# Patient Record
Sex: Female | Born: 1965
Health system: Southern US, Community
[De-identification: ages and names within clinical notes are randomized; demographics above are authoritative.]

## PROBLEM LIST (undated history)

## (undated) DIAGNOSIS — R6250 Unspecified lack of expected normal physiological development in childhood: Secondary | ICD-10-CM

## (undated) DIAGNOSIS — F329 Major depressive disorder, single episode, unspecified: Secondary | ICD-10-CM

## (undated) DIAGNOSIS — Q860 Fetal alcohol syndrome (dysmorphic): Secondary | ICD-10-CM

## (undated) DIAGNOSIS — R32 Unspecified urinary incontinence: Secondary | ICD-10-CM

## (undated) DIAGNOSIS — F32A Depression, unspecified: Secondary | ICD-10-CM

## (undated) DIAGNOSIS — F319 Bipolar disorder, unspecified: Secondary | ICD-10-CM

## (undated) DIAGNOSIS — F0281 Dementia in other diseases classified elsewhere with behavioral disturbance: Secondary | ICD-10-CM

## (undated) DIAGNOSIS — R413 Other amnesia: Secondary | ICD-10-CM

## (undated) DIAGNOSIS — F0390 Unspecified dementia without behavioral disturbance: Secondary | ICD-10-CM

## (undated) DIAGNOSIS — R3981 Functional urinary incontinence: Secondary | ICD-10-CM

## (undated) DIAGNOSIS — F71 Moderate intellectual disabilities: Secondary | ICD-10-CM

## (undated) DIAGNOSIS — H3552 Pigmentary retinal dystrophy: Secondary | ICD-10-CM

## (undated) DIAGNOSIS — F819 Developmental disorder of scholastic skills, unspecified: Secondary | ICD-10-CM

## (undated) HISTORY — DX: Fetal alcohol syndrome (dysmorphic): Q86.0

## (undated) HISTORY — DX: Unspecified dementia without behavioral disturbance: F03.90

## (undated) HISTORY — DX: Major depressive disorder, single episode, unspecified: F32.9

## (undated) HISTORY — PX: TONSILLECTOMY: SUR1361

## (undated) HISTORY — DX: Unspecified urinary incontinence: R32

## (undated) HISTORY — DX: Functional urinary incontinence: R39.81

## (undated) HISTORY — DX: Pigmentary retinal dystrophy: H35.52

## (undated) HISTORY — PX: TUBAL LIGATION: SHX77

## (undated) HISTORY — DX: Bipolar disorder, unspecified: F31.9

## (undated) HISTORY — DX: Dementia in other diseases classified elsewhere with behavioral disturbance: F02.81

## (undated) HISTORY — DX: Other amnesia: R41.3

## (undated) HISTORY — DX: Depression, unspecified: F32.A

## (undated) HISTORY — DX: Unspecified lack of expected normal physiological development in childhood: R62.50

## (undated) HISTORY — DX: Developmental disorder of scholastic skills, unspecified: F81.9

## (undated) HISTORY — DX: Moderate intellectual disabilities: F71

---

## 2002-06-14 ENCOUNTER — Encounter: Payer: Self-pay | Admitting: Family Medicine

## 2002-06-14 ENCOUNTER — Encounter: Admission: RE | Admit: 2002-06-14 | Discharge: 2002-06-14 | Payer: Self-pay | Admitting: Family Medicine

## 2002-06-22 ENCOUNTER — Ambulatory Visit: Admission: RE | Admit: 2002-06-22 | Discharge: 2002-06-22 | Payer: Self-pay | Admitting: Family Medicine

## 2005-01-14 ENCOUNTER — Other Ambulatory Visit: Admission: RE | Admit: 2005-01-14 | Discharge: 2005-01-14 | Payer: Self-pay | Admitting: Family Medicine

## 2012-07-19 ENCOUNTER — Telehealth: Payer: Self-pay | Admitting: Nurse Practitioner

## 2012-07-19 NOTE — Telephone Encounter (Signed)
PT NEEDS PE THIS MONTH. AFTER MARCH 20 AND EARLY IN THE AM. WITH MMM

## 2012-07-19 NOTE — Telephone Encounter (Signed)
PT CALLED WITH APPT

## 2012-07-28 ENCOUNTER — Ambulatory Visit (INDEPENDENT_AMBULATORY_CARE_PROVIDER_SITE_OTHER): Payer: BC Managed Care – PPO | Admitting: General Practice

## 2012-07-28 ENCOUNTER — Other Ambulatory Visit: Payer: Self-pay | Admitting: *Deleted

## 2012-07-28 ENCOUNTER — Encounter: Payer: Self-pay | Admitting: General Practice

## 2012-07-28 VITALS — BP 120/67 | HR 87 | Temp 97.3°F | Ht 67.0 in | Wt 200.0 lb

## 2012-07-28 DIAGNOSIS — Z124 Encounter for screening for malignant neoplasm of cervix: Secondary | ICD-10-CM

## 2012-07-28 DIAGNOSIS — Z780 Asymptomatic menopausal state: Secondary | ICD-10-CM

## 2012-07-28 DIAGNOSIS — Z0000 Encounter for general adult medical examination without abnormal findings: Secondary | ICD-10-CM

## 2012-07-28 LAB — LIPID PANEL
Cholesterol: 182 mg/dL (ref 0–200)
HDL: 50 mg/dL (ref >39)
LDL Cholesterol: 115 mg/dL — ABNORMAL HIGH (ref 0–99)
Total CHOL/HDL Ratio: 3.6 Ratio
Triglycerides: 87 mg/dL (ref <150)
VLDL: 17 mg/dL (ref 0–40)

## 2012-07-28 LAB — COMPLETE METABOLIC PANEL WITH GFR
ALT: 10 U/L (ref 0–35)
AST: 9 U/L (ref 0–37)
Albumin: 4.6 g/dL (ref 3.5–5.2)
Alkaline Phosphatase: 71 U/L (ref 39–117)
BUN: 18 mg/dL (ref 6–23)
CO2: 27 mEq/L (ref 19–32)
Calcium: 10.1 mg/dL (ref 8.4–10.5)
Chloride: 105 mEq/L (ref 96–112)
Creat: 1.01 mg/dL (ref 0.50–1.10)
GFR, Est African American: 77 mL/min
GFR, Est Non African American: 66 mL/min
Glucose, Bld: 97 mg/dL (ref 70–99)
Potassium: 3.7 mEq/L (ref 3.5–5.3)
Sodium: 140 mEq/L (ref 135–145)
Total Bilirubin: 0.5 mg/dL (ref 0.3–1.2)
Total Protein: 6.8 g/dL (ref 6.0–8.3)

## 2012-07-28 NOTE — Patient Instructions (Addendum)
Manic Depression (Bipolar Disorder)  Bipolar disorder is also known as manic depressive illness. It is when the brain does not function properly and causes shifts in a person's moods, energy and ability to function in everyday life. These shifts are different from the normal ups and downs that everyone experiences. Instead the shifts are severe. If this goes untreated, the person's life becomes more and more disorderly. People with this disorder can be treated can lead full and productive lives. This disorder must be managed throughout life.   SYMPTOMS    Bipolar disorder causes dramatic mood swings. These mood swings go in cycles. They cycle from extreme "highs" and irritable to deep "lows" of sadness and hopelessness.   Between the extreme moods, there are usually periods of normal mood.   Along with the mood shifts, the person will have severe changes in energy and behavior. The periods of "highs" and "lows" are called episodes of mania and depression.  Signs of mania:   Lots of energy, activity and restlessness.   Extreme "high" or good mood.   Extreme irritability.   Racing thoughts and talking very fast.   Jumping from one idea to another.   Not able to focus, easily distracted.   Little need to sleep.   Grand beliefs in one's abilities and powers.   Spending sprees.   Increased sexual drive. This can result in many sexual partners.   Poor judgment.   Abuse of drugs, particularly cocaine, alcohol, and sleeping medication.   Aggressive or provocative behavior.   A lasting period of behavior that is different from usual.   Denial that anything is wrong.  *A manic episode is identified if a "high" mood happens with three or more of the other symptoms lasting most of the day, nearly everyday for a week or longer. If the mood is more irritable in nature, four additional symptoms must be present.  Signs of depression:   Lasting feelings of sadness, anxiety, or empty mood.   Feelings of  hopelessness with negative thoughts.   Feelings of guilt, worthlessness, or helplessness.   Loss of interest or pleasure in activities once enjoyed, including sex.   Feelings of fatigue or having less energy.   Trouble focusing, making decisions, remembering.   Feeling restless or irritable.   Sleeping too little or too much.   Change in eating with possible weight gain or loss.   Feeling ongoing pain that is not caused by physical illness or injury.   Thoughts of death or suicide or suicide attempts.  *A depressive episode is identified as having five or more of the above symptoms that last most of the day, nearly everyday for two weeks or longer.  CAUSES    Research shows that there is no single cause for the disorder. Many factors act together to produce the illness.   This can be passed down from family (hereditary).   Environment may play a part.  TREATMENT    Long-term treatment is strongly recommended because bipolar disorder is a repeated illness. This disorder is better controlled if treatment is ongoing than if it is off and on.   A combination of medication and talk therapy is best for managing the disorder over time.   Medication.   Medication can be prescribed by a doctor that is an expert in treating mental disorders (psychiatrists). Medications known as "mood stabilizers" are usually prescribed to help control the illness. Other medications can be added when needed. These medicines usually treat episodes   of mania or depression that break through despite the mood stabilizer.   Talk Therapy.   Along with medication, some forms of talk therapy are helpful in providing support, education and guidance to people with the illness and their families. Studies show that this type of treatment increases mood stability, decreases need for hospitalization and improves how they function society.   Electroconvulsive Therapy (ECT).   In extreme situations where the above treatments do not work or  work too slowly to relieve severe symptoms, ECT may be considered.  Document Released: 07/27/2000 Document Revised: 07/13/2011 Document Reviewed: 03/18/2007  [employer] Patient Information 2013 [employer].

## 2012-07-28 NOTE — Progress Notes (Signed)
  Subjective:    Patient ID: Courtney Allen, female    DOB: [birth year], 47 y.o.   MRN: [id number]  HPI Presents today for wellness exam. Patient and spouse reports she is taking meds as prescribed. Patient denies any concerns at this time. Patient and spouse seem to interact well.     Review of Systems  Constitutional: Positive for appetite change. Negative for fever and chills.       Appetite decrease since starting on strattera. Retinitis pigmatosis  HENT: Negative for ear pain and neck pain.   Eyes: Negative for pain.       Reports having no night vision, optometrist aware  Respiratory: Negative for chest tightness and shortness of breath.   Cardiovascular: Negative for chest pain.  Gastrointestinal: Negative for abdominal pain, blood in stool and abdominal distention.  Genitourinary: Negative for flank pain, difficulty urinating and pelvic pain.  Musculoskeletal: Negative for back pain and joint swelling.  Skin: Negative for color change and rash.  Neurological: Negative for dizziness, weakness, numbness and headaches.  Psychiatric/Behavioral: Negative for behavioral problems. The patient is not nervous/anxious.        Medications are used to help pt focus.       Objective:   Physical Exam  Constitutional: She appears well-developed and well-nourished.  HENT:  Head: Normocephalic and atraumatic.  Right Ear: External ear normal.  Left Ear: External ear normal.  Mouth/Throat: Oropharynx is clear and moist.  Eyes: Conjunctivae and EOM are normal. Pupils are equal, round, and reactive to light. Right eye exhibits no discharge.  Neck: Normal range of motion. No JVD present.  Cardiovascular: Normal rate, regular rhythm and normal heart sounds.   Pulmonary/Chest: Effort normal and breath sounds normal. No respiratory distress. She exhibits no tenderness.  Abdominal: Soft. Bowel sounds are normal. She exhibits no distension. There is no tenderness. There is no rebound. Hernia  confirmed negative in the right inguinal area and confirmed negative in the left inguinal area.  Genitourinary: Vagina normal and uterus normal. Guaiac negative stool. No breast swelling, tenderness, discharge or bleeding. Pelvic exam was performed with patient supine. There is no rash, tenderness, lesion or injury on the right labia. There is no rash, tenderness, lesion or injury on the left labia.  Musculoskeletal: Normal range of motion.  Lymphadenopathy:    She has no cervical adenopathy.  Neurological: She is alert.  Patient oriented to self, place, negative for date  Skin: Skin is warm and dry. No erythema.  Psychiatric:  Patient had very flat affect           Assessment & Plan:  Continue all current medications Labs pending, cmp, lipid panel F/u in 3 months Discussed exercise and diet    Raymon Mutton, FNP-C

## 2012-07-29 LAB — PAP IG W/ RFLX HPV ASCU

## 2012-10-14 DIAGNOSIS — F0280 Dementia in other diseases classified elsewhere without behavioral disturbance: Secondary | ICD-10-CM

## 2012-11-29 ENCOUNTER — Ambulatory Visit (INDEPENDENT_AMBULATORY_CARE_PROVIDER_SITE_OTHER): Payer: BC Managed Care – PPO | Admitting: Family Medicine

## 2012-11-29 VITALS — BP 124/72 | HR 68 | Temp 98.4°F | Ht 67.0 in

## 2012-11-29 DIAGNOSIS — R51 Headache: Secondary | ICD-10-CM

## 2012-11-29 DIAGNOSIS — H9209 Otalgia, unspecified ear: Secondary | ICD-10-CM

## 2012-11-29 DIAGNOSIS — F919 Conduct disorder, unspecified: Secondary | ICD-10-CM

## 2012-11-29 DIAGNOSIS — R4189 Other symptoms and signs involving cognitive functions and awareness: Secondary | ICD-10-CM

## 2012-11-29 DIAGNOSIS — R4689 Other symptoms and signs involving appearance and behavior: Secondary | ICD-10-CM

## 2012-11-29 DIAGNOSIS — H9203 Otalgia, bilateral: Secondary | ICD-10-CM

## 2012-11-29 DIAGNOSIS — J019 Acute sinusitis, unspecified: Secondary | ICD-10-CM

## 2012-11-29 MED ORDER — CEFDINIR 300 MG PO CAPS: 300.0000 mg | ORAL_CAPSULE | Freq: Two times a day (BID) | ORAL | Status: DC

## 2012-11-29 NOTE — Progress Notes (Signed)
Patient ID: Courtney Allen, female   DOB: 1965-11-03, 47 y.o.   MRN: [id number] SUBJECTIVE: CC: Chief Complaint  Patient presents with  . Otalgia  . Nasal Congestion  . Headache    HPI: Symptoms as above. Complains of congestion, sinus pain and thather ears hurt and also her forehead. She is moaning and repetitious in her speak. She exhibits cognitive impairment.  Husband gives h/o that he understands that she may have initially fetal alcohol syndrome. Hoever, they have been married for years , and patient was always mentally a bit slow. However since the last 1 to 1 1/2 years the patient has been Dx with retinitis pigmentosa and dementia. The medications are not working for dementia. Also, of note is that her older brother died inhis early 53s from a similar deterioration without a  Dx being made  Past Medical History  Diagnosis Date  . Depression   . Bipolar I disorder, most recent episode (or current) unspecified   . Urinary incontinence    Past Surgical History  Procedure Laterality Date  . Tubal ligation    . Cesarean section    . Tonsillectomy     History   Social History  . Marital Status: Married    Spouse Name: N/A    Number of Children: N/A  . Years of Education: N/A   Occupational History  . Not on file.   Social History Main Topics  . Smoking status: Never Smoker   . Smokeless tobacco: Not on file  . Alcohol Use: No  . Drug Use: No  . Sexually Active: Not on file   Other Topics Concern  . Not on file   Social History Narrative  . No narrative on file   History reviewed. No pertinent family history. Current Outpatient Prescriptions on File Prior to Visit  Medication Sig Dispense Refill  . buPROPion (WELLBUTRIN XL) 300 MG 24 hr tablet Take 150 mg by mouth daily.        No current facility-administered medications on file prior to visit.   No Known Allergies  There is no immunization history on file for this patient. Prior to Admission medications    Medication Sig Start Date End Date Taking? Authorizing Provider  buPROPion (WELLBUTRIN XL) 300 MG 24 hr tablet Take 150 mg by mouth daily.    Yes Historical Provider, MD  donepezil (ARICEPT) 10 MG tablet Take 10 mg by mouth at bedtime as needed.   Yes Historical Provider, MD  hydrOXYzine (ATARAX/VISTARIL) 25 MG tablet Take 25 mg by mouth 3 (three) times daily as needed for itching.   Yes Historical Provider, MD  memantine (NAMENDA) 10 MG tablet Take 10 mg by mouth daily.   Yes Historical Provider, MD  mirtazapine (REMERON) 15 MG tablet Take 7.5 mg by mouth at bedtime.   Yes Historical Provider, MD  cefdinir (OMNICEF) 300 MG capsule Take 1 capsule (300 mg total) by mouth 2 (two) times daily. 11/29/12   Ileana Ladd, MD     ROS: As above in the HPI. All other systems are stable or negative.  OBJECTIVE: APPEARANCE:  Patient in no acute distress.The patient appeared well nourished and normally developed. Acyanotic. Waist: VITAL SIGNS:BP 124/72  Pulse 68  Temp(Src) 98.4 F (36.9 C) (Oral)  Ht 5\' 7"  (1.702 m) WF c/o ear pain repeatedly.  SKIN: warm and  Dry without overt rashes, tattoos and scars  HEAD and Neck: without JVD, Head and scalp: normal Eyes:No scleral icterus. Fundi normal, eye movements normal.  Ears: Auricle normal, canal normal, Tympanic membranes normal, insufflation abnormal TMs do not move.. Nose: nasal congestion. Paranasal sinus tenderness. Throat: normal Neck & thyroid: normal  CHEST & LUNGS: Chest wall: normal Lungs: Clear  CVS: Reveals the PMI to be normally located. Regular rhythm, First and Second Heart sounds are normal,  absence of murmurs, rubs or gallops. Peripheral vasculature: Radial pulses: normal Dorsal pedis pulses: normal Posterior pulses: normal  ABDOMEN:  Appearance: normal Benign, no organomegaly, no masses, no Abdominal Aortic enlargement. No Guarding , no rebound. No Bruits. Bowel sounds: normal  RECTAL: N/A GU:  N/A  EXTREMETIES: nonedematous. Both Femoral and Pedal pulses are normal.  MUSCULOSKELETAL:  Spine: normal Joints: intact  NEUROLOGIC: oriented to  person; no localizing signs in extremities. However patient's speech is slow and she exhibits cognitive impairment.Strength is normal   ASSESSMENT: Otalgia of both ears - Plan: cefdinir (OMNICEF) 300 MG capsule  Sinusitis, acute - Plan: cefdinir (OMNICEF) 300 MG capsule  Cognitive and behavioral changes - Plan: MR Brain W Wo Contrast  Headache(784.0) - Plan: MR Brain W Wo Contrast  PLAN: Orders Placed This Encounter  Procedures  . MR Brain W Wo Contrast    Standing Status: Future     Number of Occurrences:      Standing Expiration Date: 01/30/2014    Order Specific Question:  Reason for Exam (SYMPTOM  OR DIAGNOSIS REQUIRED)    Answer:  47 year old with headaches and rapid  cognitive deterioration, dementia. r/o IC Lesion    Order Specific Question:  Is the patient pregnant?    Answer:  No    Order Specific Question:  Preferred imaging location?    Answer:  [id number]    Order Specific Question:  Does the patient have a pacemaker, internal devices, implants, aneury    Answer:  No   Meds ordered this encounter  Medications  . donepezil (ARICEPT) 10 MG tablet    Sig: Take 10 mg by mouth at bedtime as needed.  . memantine (NAMENDA) 10 MG tablet    Sig: Take 10 mg by mouth daily.  . mirtazapine (REMERON) 15 MG tablet    Sig: Take 7.5 mg by mouth at bedtime.  . hydrOXYzine (ATARAX/VISTARIL) 25 MG tablet    Sig: Take 25 mg by mouth 3 (three) times daily as needed for itching.  . cefdinir (OMNICEF) 300 MG capsule    Sig: Take 1 capsule (300 mg total) by mouth 2 (two) times daily.    Dispense:  20 capsule    Refill:  0   Results for orders placed in visit on 07/28/12  COMPLETE METABOLIC PANEL WITH GFR      Result Value Range   Sodium 140  135 - 145 mEq/L   Potassium 3.7  3.5 - 5.3 mEq/L   Chloride 105  96 - 112 mEq/L    CO2 27  19 - 32 mEq/L   Glucose, Bld 97  70 - 99 mg/dL   BUN 18  6 - 23 mg/dL   Creat 1.61  0.96 - 0.45 mg/dL   Total Bilirubin 0.5  0.3 - 1.2 mg/dL   Alkaline Phosphatase 71  39 - 117 U/L   AST 9  0 - 37 U/L   ALT 10  0 - 35 U/L   Total Protein 6.8  6.0 - 8.3 g/dL   Albumin 4.6  3.5 - 5.2 g/dL   Calcium 40.9  8.4 - 81.1 mg/dL   GFR, Est African American  77     GFR, Est Non African American 66    LIPID PANEL      Result Value Range   Cholesterol 182  0 - 200 mg/dL   Triglycerides 87  <130 mg/dL   HDL 50  >86 mg/dL   Total CHOL/HDL Ratio 3.6     VLDL 17  0 - 40 mg/dL   LDL Cholesterol 578 (*) 0 - 99 mg/dL  PAP IG W/ RFLX HPV ASCU      Result Value Range   Specimen adequacy:       FINAL DIAGNOSIS:       COMMENTS:       Cytotechnologist:        Await the MRI results. Keep appointment with the Neurologist.  Return if symptoms worsen or fail to improve.  Zelta Enfield P. Modesto Charon, M.D.

## 2012-12-04 ENCOUNTER — Ambulatory Visit
Admission: RE | Admit: 2012-12-04 | Discharge: 2012-12-04 | Disposition: A | Discharge status: Home / Self Care | Payer: BC Managed Care – PPO | Source: Ambulatory Visit | Attending: Family Medicine | Admitting: Family Medicine

## 2012-12-04 DIAGNOSIS — R51 Headache: Secondary | ICD-10-CM

## 2012-12-04 DIAGNOSIS — R4189 Other symptoms and signs involving cognitive functions and awareness: Secondary | ICD-10-CM

## 2012-12-04 MED ORDER — GADOBENATE DIMEGLUMINE 529 MG/ML IV SOLN
19.0000 mL | Freq: Once | INTRAVENOUS | Status: AC | PRN
  Administered 2012-12-04: 19 mL via INTRAVENOUS

## 2012-12-05 NOTE — Progress Notes (Signed)
Quick Note:  MRI abnormal. Atrophy more advanced than for her age. No tumors. Keep appointment with the neurologist. ______

## 2013-01-17 ENCOUNTER — Encounter: Payer: Self-pay | Admitting: Neurology

## 2013-01-18 ENCOUNTER — Ambulatory Visit (INDEPENDENT_AMBULATORY_CARE_PROVIDER_SITE_OTHER): Payer: BC Managed Care – PPO | Admitting: Neurology

## 2013-01-18 ENCOUNTER — Encounter: Payer: Self-pay | Admitting: Neurology

## 2013-01-18 VITALS — BP 113/69 | HR 57 | Resp 17 | Ht 67.0 in | Wt 180.0 lb

## 2013-01-18 DIAGNOSIS — IMO0002 Reserved for concepts with insufficient information to code with codable children: Secondary | ICD-10-CM

## 2013-01-18 DIAGNOSIS — F0281 Dementia in other diseases classified elsewhere with behavioral disturbance: Secondary | ICD-10-CM

## 2013-01-18 DIAGNOSIS — F71 Moderate intellectual disabilities: Secondary | ICD-10-CM

## 2013-01-18 DIAGNOSIS — F02818 Dementia in other diseases classified elsewhere, unspecified severity, with other behavioral disturbance: Secondary | ICD-10-CM

## 2013-01-18 DIAGNOSIS — F819 Developmental disorder of scholastic skills, unspecified: Secondary | ICD-10-CM | POA: Insufficient documentation

## 2013-01-18 DIAGNOSIS — F8189 Other developmental disorders of scholastic skills: Secondary | ICD-10-CM

## 2013-01-18 DIAGNOSIS — Q860 Fetal alcohol syndrome (dysmorphic): Secondary | ICD-10-CM

## 2013-01-18 DIAGNOSIS — G2571 Drug induced akathisia: Secondary | ICD-10-CM

## 2013-01-18 DIAGNOSIS — R259 Unspecified abnormal involuntary movements: Secondary | ICD-10-CM

## 2013-01-18 DIAGNOSIS — R3981 Functional urinary incontinence: Secondary | ICD-10-CM | POA: Insufficient documentation

## 2013-01-18 DIAGNOSIS — H3552 Pigmentary retinal dystrophy: Secondary | ICD-10-CM | POA: Insufficient documentation

## 2013-01-18 HISTORY — DX: Functional urinary incontinence: R39.81

## 2013-01-18 HISTORY — DX: Fetal alcohol syndrome (dysmorphic): Q86.0

## 2013-01-18 HISTORY — DX: Moderate intellectual disabilities: F71

## 2013-01-18 HISTORY — DX: Dementia in other diseases classified elsewhere, unspecified severity, with other behavioral disturbance: F02.818

## 2013-01-18 HISTORY — DX: Developmental disorder of scholastic skills, unspecified: F81.9

## 2013-01-18 HISTORY — DX: Pigmentary retinal dystrophy: H35.52

## 2013-01-18 HISTORY — DX: Dementia in other diseases classified elsewhere with behavioral disturbance: F02.81

## 2013-01-18 MED ORDER — PROPRANOLOL HCL 10 MG PO TABS: 10.0000 mg | ORAL_TABLET | ORAL | Status: DC

## 2013-01-18 MED ORDER — DONEPEZIL HCL 10 MG PO TABS: 10.0000 mg | ORAL_TABLET | Freq: Every evening | ORAL | Status: DC | PRN

## 2013-01-18 MED ORDER — MIRTAZAPINE 15 MG PO TABS: 7.5000 mg | ORAL_TABLET | Freq: Every day | ORAL | Status: DC

## 2013-01-18 MED ORDER — BUPROPION HCL ER (XL) 150 MG PO TB24: 150.0000 mg | ORAL_TABLET | Freq: Every day | ORAL | Status: DC

## 2013-01-18 NOTE — Patient Instructions (Signed)
Alzheimer's Disease Alzheimer's disease is a breaking down of the brain that sometimes happens with aging. It often affects memory, thinking, and speaking. It also affects how you get along with people and job performance. Often the changes come on slowly and are not very noticeable. The changes may be silent and hidden for a long time, even from family and friends. Mood and personality changes often cause the family to seek medical care. CAUSES  Alzheimer's disease is the leading cause of dementia. Dementia is a reduced ability of the working of the brain. Alzheimer's is one type of dementia and is caused by small changes in the brain. There are many causes of Alzheimer's disease. DIAGNOSIS  There are no specific tests for Alzheimer's disease, other than doing a biopsy(tissue sample) of the brain. This is not usually done. Physical examination and history often provide the best clues for a diagnosis. Often the diagnosis may be made over time, with more observation.  TREATMENT  There is no specific treatment for Alzheimer's disease. Your caregivers will discuss the particular problems you are dealing with or may deal with. They can help direct you to other caregivers who can help in the care of Alzheimer's patients.  Document Released: 01/28/2005 Document Revised: 07/13/2011 Document Reviewed: 09/20/2006 [facility] Patient Information 2014 [place], [place].

## 2013-01-18 NOTE — Progress Notes (Addendum)
[facility]  Provider:  Melvyn Novas, M D  Referring Provider: Ernestina Penna, MD Primary Care Physician:  Rudi Heap, MD  Chief Complaint  Patient presents with  . New Evaluation    dementia,Poulos,mild mental retardation, paper referral,rm 11    HPI:  Courtney Allen is a 47 y.o. female  Is seen here as a referral/ revisit  from Dr. Gerome Sam / Dr. Leonides Cave:  This patient  Has retinitis pigmentosa as does her brother, both were born to an alcoholic Mother. The  patient had learning disabilities, never graduated from [employer],  limited reading and writing abilities- that have further decreased.  Her brother and herself are reportedly belligerent, impulsive, and have  Trouble with anger management. Dr Leonides Cave suspected early dementia and she needs to continue treatment with antipsychotics.  The patient begun treatment for a memory  problems with Namenda and Aricept she developed compulsive scratching, repetitive motions and  sudden restlessness occurred , acatesia  and  She continues to have non verbal output. She does not necessarily make eye contact. She is now on Remeron  For sleep and to increase her appetite after she lost a lot of weight.   Her husband  Is afraid that she may need to move to an institution. She has no hallucinations, but rare delusions. She has never finished high school , she has 3 healthy , bright  children , is married over 30 years .   Review of Systems: Out of a complete 14 system review, the patient complains of only the following symptoms, and all other reviewed systems are negative.  MMSE - 13 out of 26 points, adjusted or blind patient. Urinary incontinence, stiff  With propulsive gait, small steps, repetitive non-verbal sounds,  Difficulties with smooth movements.    History   Social History  . Marital Status: Married    Spouse Name: [name]    Number of Children: 3  . Years of Education: 11   Occupational History  . disabled     Social History Main Topics  . Smoking status: Never Smoker   . Smokeless tobacco: Never Used  . Alcohol Use: No  . Drug Use: No  . Sexual Activity: Not on file   Other Topics Concern  . Not on file   Social History Narrative  . No narrative on file    Family History  Problem Relation Age of Onset  . Alcohol abuse Mother   . Emphysema Brother     Past Medical History  Diagnosis Date  . Depression   . Bipolar I disorder, most recent episode (or current) unspecified   . Urinary incontinence   . Memory loss   . Developmental delay   . Dementia     Past Surgical History  Procedure Laterality Date  . Tubal ligation    . Cesarean section    . Tonsillectomy      Current Outpatient Prescriptions  Medication Sig Dispense Refill  . buPROPion (WELLBUTRIN XL) 300 MG 24 hr tablet Take 150 mg by mouth daily.       . cholecalciferol (VITAMIN D) 1000 UNITS tablet Take 1,000 Units by mouth daily.      Marland Kitchen donepezil (ARICEPT) 10 MG tablet Take 10 mg by mouth at bedtime as needed.      . mirtazapine (REMERON) 15 MG tablet Take 7.5 mg by mouth at bedtime.       No current facility-administered medications for this visit.    Allergies as of 01/18/2013  . (  No Known Allergies)    Vitals: BP 113/69  Pulse 57  Resp 17  Ht 5\' 7"  (1.702 m)  Wt 180 lb (81.647 kg)  BMI 28.19 kg/m2 Last Weight:  Wt Readings from Last 1 Encounters:  01/18/13 180 lb (81.647 kg)   Last Height:   Ht Readings from Last 1 Encounters:  01/18/13 5\' 7"  (1.702 m)    Physical exam:  General: The patient is awake, alert and appears not in acute distress. The patient is well groomed. Head: Normocephalic, atraumatic. Neck is supple. Mallampati 3 , neck circumference: 14.5 , Cardiovascular:  Regular rate and rhythm, without  murmurs or carotid bruit, and without distended neck veins. Respiratory: Lungs are clear to auscultation. Skin:  Without evidence of edema, or rash Trunk: propulsive.   Neurologic  exam : The patient is awake and alert, oriented to place and time.  Memory severly impaired ,. abnormal attention span & concentration ability. Speech is  Non- fluent , aphasia. Mood and affect are aloof, not depressed but " Uninvolved".  Cranial nerves: Pupils are equal and briskly reactive to light. Retinitis pigmentosa,. Extraocular movements  in vertical and horizontal planes not intact a- due to limites tunnel vision , Visual fields.. Hearing to finger rub intact.  Facial sensation intact to fine touch. Facial motor strength is bilaterally reduced , droopy mouth - tongue and uvula move midline.  Motor exam:  Decreased  tone and normal muscle bulk ,   Sensory:  Fine touch, pinprick and vibration were tested in all extremities. Proprioception isnormal.  Coordination: Rapid alternating movements in the fingers/hands is tested and normal.  Finger-to-nose maneuver tested and normal without evidence of ataxia, dysmetria or tremor.  Gait and station: Patient walks without assistive device -Stance is stable and normal. Tandem gait is deferred, the patient has propulsive e tendency , falling risk higher when on incline or  Walking downstairs. Gait and turns  fragmented. Romberg testing is abnormal. Wide based.   Deep tendon reflexes: in the  upper and lower extremities are symmetric and intact. Babinski maneuver response is equivocal .   Assessment:  After physical and neurologic examination, review of laboratory studies, imaging, neurophysiology testing and pre-existing records, assessment is that of a multifactorial cognitive decline.  I need to review Dr. Maxwell Marion notes in detail , I agree with a dementia process.  The patient has some features of MRDD learning disability and retinitis related vision loss, genetic . Filum labium is present.   Coarse motor skills,  Urinary incontinence , MMSE 13 -26 points. NPH rule out needed, CT or MRI review- 12-04-12 without evidence of NPH. EEG needed,   Alzheimer's superimposed on fetal alcohol syndrome is likely, plus psychiatric history and medication related changes in motor function.     Addendum : The results of the patient's neuropsychological testing for finally available for view at the end of her visit. Dr. fell from consider the patient mildly mentally retarded based on attending a regular high school, in that she did not graduate from it. He noted her learning disabilities. Review of the sup types of tests performed, makes the diagnosis of dementia most likely. Concerning is that the patient had only recently imaging studies of the brain but there is no comparison material to show [place] if the small brain size is long-standing, or a recent development. The patient's body habitus, her gait, incontinence and the restlessness or petechia may reflect tardive dyskinesia and secondary parkinsonism.  I have ordered an EEG for today, and  we'll get in touch with Dr. Lance Coon to see if the patient has been treated with neuroleptics in the past, had delusional or hallucinatory psychosis. Visit duration 70 minutes off which holds was spent in obtaining psychiatric, psychological, medical history, family history and establishing and need for home health care or other  Assistance,  in allowing to this patient to stay and her hormone environment as long as possible.

## 2013-01-19 ENCOUNTER — Telehealth: Payer: Self-pay | Admitting: Neurology

## 2013-01-19 DIAGNOSIS — F71 Moderate intellectual disabilities: Secondary | ICD-10-CM

## 2013-01-19 DIAGNOSIS — Q860 Fetal alcohol syndrome (dysmorphic): Secondary | ICD-10-CM

## 2013-01-19 DIAGNOSIS — R3981 Functional urinary incontinence: Secondary | ICD-10-CM

## 2013-01-19 NOTE — Telephone Encounter (Signed)
Patient is requesting 30mg  rather than 15mg  of Remeron.  Okay to change dose?  Please advise.  Thank you.

## 2013-01-20 ENCOUNTER — Ambulatory Visit (INDEPENDENT_AMBULATORY_CARE_PROVIDER_SITE_OTHER): Payer: BC Managed Care – PPO | Admitting: Family Medicine

## 2013-01-20 ENCOUNTER — Encounter: Payer: Self-pay | Admitting: Family Medicine

## 2013-01-20 VITALS — BP 100/61 | HR 58 | Temp 98.8°F | Ht 67.0 in | Wt 181.0 lb

## 2013-01-20 DIAGNOSIS — R300 Dysuria: Secondary | ICD-10-CM

## 2013-01-20 MED ORDER — CEPHALEXIN 500 MG PO CAPS: 500.0000 mg | ORAL_CAPSULE | Freq: Three times a day (TID) | ORAL | Status: DC

## 2013-01-20 NOTE — Progress Notes (Signed)
  Subjective:    Patient ID: Courtney Allen, female    DOB: June 21, 1965, 47 y.o.   MRN: [id number]  HPI DYSURIA Onset:  4 weeks  Description: worsening urinary incontinence in setting of baseline urinary incontinence, dysuria. No flank pain. No hematuria. Last UTI > 1 year ago.  Modifying factors: Baseline dementia and MR. Has been taking aricept. Husband is wondering if Aricept is causing worsening urinary incontinence.  Symptoms Urgency:  yes Frequency: yes  Hesitancy:  yes Hematuria:  n Flank Pain:  no Fever: no Nausea/Vomiting:  no Missed LMP: no STD exposure: no Discharge: no Irritants: no Rash: no  Red Flags   More than 3 UTI's last 12 months:  no PMH of  Diabetes or Immunosuppression:  no Renal Disease/Calculi: no Urinary Tract Abnormality:  no Instrumentation or Trauma: no      Review of Systems  All other systems reviewed and are negative.       Objective:   Physical Exam  Constitutional: She appears well-nourished.  HENT:  Head: Normocephalic and atraumatic.  Eyes: Conjunctivae are normal. Pupils are equal, round, and reactive to light.  Neck: Normal range of motion. Neck supple.  Cardiovascular: Normal rate and regular rhythm.   Pulmonary/Chest: Effort normal and breath sounds normal.  Abdominal: Soft. Bowel sounds are normal.  No flank pain, minimal suprapubic tenderness.  Musculoskeletal: Normal range of motion.  Neurological:  Verbal, ruminating.  Skin: Skin is warm.          Assessment & Plan:  Dysuria - Plan: POCT UA - Microscopic Only, POCT urinalysis dipstick, Urine culture, cephALEXin (KEFLEX) 500 MG capsule  Will treat with Keflex. Urine culture. It is likely that Aricept is having A procholinergic effects that is worsening urinary incontinence. Discussed with husband that this may need to be adjusted with the neuropsychologist. Will otherwise consider urology followup for this issue if it persists. Discuss infectious and  genitourinary red flags at length with the patient's husband including fever, back pain. Followup as needed.

## 2013-01-20 NOTE — Addendum Note (Signed)
Addended by: Prescott Gum on: 01/20/2013 12:09 PM   Modules accepted: Orders

## 2013-01-23 MED ORDER — MIRTAZAPINE 15 MG PO TABS: 30.0000 mg | ORAL_TABLET | Freq: Every day | ORAL | Status: DC

## 2013-01-23 NOTE — Telephone Encounter (Signed)
Ok to double, prescribed for 90 days.

## 2013-01-27 ENCOUNTER — Other Ambulatory Visit (INDEPENDENT_AMBULATORY_CARE_PROVIDER_SITE_OTHER): Payer: Self-pay

## 2013-01-27 ENCOUNTER — Telehealth: Payer: Self-pay | Admitting: Family Medicine

## 2013-01-27 DIAGNOSIS — Z0289 Encounter for other administrative examinations: Secondary | ICD-10-CM

## 2013-01-27 NOTE — Telephone Encounter (Signed)
Urine specimen was not collected.

## 2013-02-03 NOTE — Telephone Encounter (Signed)
Multiple attempts made to reach patient by phone. 

## 2013-02-27 ENCOUNTER — Encounter: Payer: Self-pay | Admitting: General Practice

## 2013-02-27 ENCOUNTER — Ambulatory Visit (INDEPENDENT_AMBULATORY_CARE_PROVIDER_SITE_OTHER): Payer: BC Managed Care – PPO | Admitting: General Practice

## 2013-02-27 ENCOUNTER — Encounter (INDEPENDENT_AMBULATORY_CARE_PROVIDER_SITE_OTHER): Payer: Self-pay

## 2013-02-27 VITALS — BP 105/63 | HR 55 | Temp 97.0°F | Ht 67.0 in | Wt 180.0 lb

## 2013-02-27 DIAGNOSIS — R350 Frequency of micturition: Secondary | ICD-10-CM

## 2013-02-27 DIAGNOSIS — N390 Urinary tract infection, site not specified: Secondary | ICD-10-CM

## 2013-02-27 DIAGNOSIS — IMO0001 Reserved for inherently not codable concepts without codable children: Secondary | ICD-10-CM

## 2013-02-27 LAB — POCT URINALYSIS DIPSTICK
Bilirubin, UA: NEGATIVE
Blood, UA: NEGATIVE
Glucose, UA: NEGATIVE
Ketones, UA: NEGATIVE
Nitrite, UA: NEGATIVE
Protein, UA: NEGATIVE
Spec Grav, UA: 1.025
Urobilinogen, UA: NEGATIVE
pH, UA: 5

## 2013-02-27 LAB — POCT UA - MICROSCOPIC ONLY
Bacteria, U Microscopic: NEGATIVE
Casts, Ur, LPF, POC: NEGATIVE
Crystals, Ur, HPF, POC: NEGATIVE
Mucus, UA: NEGATIVE
RBC, urine, microscopic: NEGATIVE
Yeast, UA: NEGATIVE

## 2013-02-27 MED ORDER — CIPROFLOXACIN HCL 500 MG PO TABS: 500.0000 mg | ORAL_TABLET | Freq: Two times a day (BID) | ORAL | Status: DC

## 2013-02-27 NOTE — Progress Notes (Signed)
  Subjective:    Patient ID: Courtney Allen, female    DOB: 05/27/1965, 47 y.o.   MRN: [id number]  Urinary Frequency  This is a new problem. The current episode started 1 to 4 weeks ago (after being started on dementia medication). The problem occurs every urination. The problem has been unchanged. The quality of the pain is described as burning. There has been no fever. She is sexually active. There is no history of pyelonephritis. Associated symptoms include frequency and urgency. Pertinent negatives include no chills, flank pain or hematuria. She has tried nothing for the symptoms. There is no history of catheterization, kidney stones, recurrent UTIs or a urological procedure.       Review of Systems  Constitutional: Negative for fever and chills.  Respiratory: Negative for chest tightness and shortness of breath.   Cardiovascular: Negative for chest pain.  Genitourinary: Positive for urgency and frequency. Negative for hematuria and flank pain.  Neurological: Negative for dizziness, weakness and headaches.       Objective:   Physical Exam  Constitutional: She appears well-developed and well-nourished.  Cardiovascular: Normal rate, regular rhythm and normal heart sounds.   Pulmonary/Chest: Effort normal and breath sounds normal.  Abdominal: Soft. Bowel sounds are normal. She exhibits no distension. There is tenderness in the suprapubic area.  Neurological: She is alert.  Oriented to self  Skin: Skin is warm and dry.  Psychiatric:  Behavior abnormal. Dementia with behavioral disturbances     Results for orders placed in visit on 02/27/13  POCT URINALYSIS DIPSTICK      Result Value Range   Color, UA yellow     Clarity, UA clear     Glucose, UA neg     Bilirubin, UA neg     Ketones, UA neg     Spec Grav, UA 1.025     Blood, UA neg     pH, UA 5.0     Protein, UA neg     Urobilinogen, UA negative     Nitrite, UA neg     Leukocytes, UA Trace    POCT UA - MICROSCOPIC ONLY       Result Value Range   WBC, Ur, HPF, POC 5-10     RBC, urine, microscopic neg     Bacteria, U Microscopic neg     Mucus, UA neg     Epithelial cells, urine per micros occ     Crystals, Ur, HPF, POC neg     Casts, Ur, LPF, POC neg     Yeast, UA neg         Assessment & Plan:  1. Frequency  - POCT urinalysis dipstick - POCT UA - Microscopic Only  2. UTI (urinary tract infection)  - ciprofloxacin (CIPRO) 500 MG tablet; Take 1 tablet (500 mg total) by mouth 2 (two) times daily.  Dispense: 20 tablet; Refill: 0 -Increase fluid intake AZO over the counter X2 days Frequent voiding Proper perineal hygiene RTO prn Patient verbalized understanding Coralie Keens, FNP-C

## 2013-02-27 NOTE — Patient Instructions (Signed)
Urinary Tract Infection  Urinary tract infections (UTIs) can develop anywhere along your urinary tract. Your urinary tract is your body's drainage system for removing wastes and extra water. Your urinary tract includes two kidneys, two ureters, a bladder, and a urethra. Your kidneys are a pair of bean-shaped organs. Each kidney is about the size of your fist. They are located below your ribs, one on each side of your spine.  CAUSES  Infections are caused by microbes, which are microscopic organisms, including fungi, viruses, and bacteria. These organisms are so small that they can only be seen through a microscope. Bacteria are the microbes that most commonly cause UTIs.  SYMPTOMS   Symptoms of UTIs may vary by age and gender of the patient and by the location of the infection. Symptoms in young women typically include a frequent and intense urge to urinate and a painful, burning feeling in the bladder or urethra during urination. Older women and men are more likely to be tired, shaky, and weak and have muscle aches and abdominal pain. A fever may mean the infection is in your kidneys. Other symptoms of a kidney infection include pain in your back or sides below the ribs, nausea, and vomiting.  DIAGNOSIS  To diagnose a UTI, your caregiver will ask you about your symptoms. Your caregiver also will ask to provide a urine sample. The urine sample will be tested for bacteria and white blood cells. White blood cells are made by your body to help fight infection.  TREATMENT   Typically, UTIs can be treated with medication. Because most UTIs are caused by a bacterial infection, they usually can be treated with the use of antibiotics. The choice of antibiotic and length of treatment depend on your symptoms and the type of bacteria causing your infection.  HOME CARE INSTRUCTIONS   If you were prescribed antibiotics, take them exactly as your caregiver instructs you. Finish the medication even if you feel better after you  have only taken some of the medication.   Drink enough water and fluids to keep your urine clear or pale yellow.   Avoid caffeine, tea, and carbonated beverages. They tend to irritate your bladder.   Empty your bladder often. Avoid holding urine for long periods of time.   Empty your bladder before and after sexual intercourse.   After a bowel movement, women should cleanse from front to back. Use each tissue only once.  SEEK MEDICAL CARE IF:    You have back pain.   You develop a fever.   Your symptoms do not begin to resolve within 3 days.  SEEK IMMEDIATE MEDICAL CARE IF:    You have severe back pain or lower abdominal pain.   You develop chills.   You have nausea or vomiting.   You have continued burning or discomfort with urination.  MAKE SURE YOU:    Understand these instructions.   Will watch your condition.   Will get help right away if you are not doing well or get worse.  Document Released: 01/28/2005 Document Revised: 10/20/2011 Document Reviewed: 05/29/2011  [employer] Patient Information 2014 [employer].

## 2013-03-02 ENCOUNTER — Telehealth: Payer: Self-pay | Admitting: *Deleted

## 2013-03-02 NOTE — Telephone Encounter (Signed)
Call pt to notify her that Fannie Knee needs to reshed her to next week. Patient did not answer after 3 call. Lft VM for her to call and resched

## 2013-03-03 ENCOUNTER — Other Ambulatory Visit: Payer: BC Managed Care – PPO | Admitting: Radiology

## 2013-03-10 ENCOUNTER — Ambulatory Visit (INDEPENDENT_AMBULATORY_CARE_PROVIDER_SITE_OTHER): Payer: BC Managed Care – PPO

## 2013-03-10 DIAGNOSIS — F71 Moderate intellectual disabilities: Secondary | ICD-10-CM

## 2013-03-10 DIAGNOSIS — Q860 Fetal alcohol syndrome (dysmorphic): Secondary | ICD-10-CM

## 2013-03-10 DIAGNOSIS — R3981 Functional urinary incontinence: Secondary | ICD-10-CM

## 2013-03-10 DIAGNOSIS — IMO0002 Reserved for concepts with insufficient information to code with codable children: Secondary | ICD-10-CM

## 2013-03-10 NOTE — Procedures (Signed)
  History:  Courtney Allen is a 47 year old patient with a history of fetal alcohol syndrome, and some history of memory problems. The patient has retinitis pigmentosa. The patient is being evaluated for some problems with a progressive memory disorder.  This is a routine EEG. No skull defects are noted. Medications include Wellbutrin, vitamin D, Cipro, Aricept, and Remeron.   EEG classification: Essentially normal awake  Description of the recording: The background rhythms of this recording consists of a fairly well modulated medium amplitude alpha rhythm of 9 Hz that is reactive to eye opening and closure. As the record progresses, the patient appears to remain in the waking state throughout the recording. Photic stimulation was performed, resulting in a bilateral and symmetric photic driving response. Hyperventilation was not performed. Throughout the recording, prominent head movement artifact was seen, oftentimes obscuring background rhythm activities. At no time during the recording does there appear to be evidence of spike or spike wave discharges or evidence of focal slowing. EKG monitor shows no evidence of cardiac rhythm abnormalities with a heart rate of 56.  Impression: This is an essentially normal EEG recording in the waking state. No evidence of ictal or interictal discharges are seen. This was a technically difficult study secondary to excessive head movement associated with artifact on EEG.

## 2013-03-17 ENCOUNTER — Telehealth: Payer: Self-pay | Admitting: Neurology

## 2013-03-22 ENCOUNTER — Telehealth: Payer: Self-pay | Admitting: Neurology

## 2013-03-22 NOTE — Telephone Encounter (Signed)
Spoke with husband and said that will call as soon as physician reviews results

## 2013-03-22 NOTE — Telephone Encounter (Signed)
See notes from 11/19

## 2013-03-23 NOTE — Telephone Encounter (Signed)
Results called to patient per Lynden Ang B

## 2013-04-19 ENCOUNTER — Ambulatory Visit (INDEPENDENT_AMBULATORY_CARE_PROVIDER_SITE_OTHER): Payer: BC Managed Care – PPO | Admitting: Family Medicine

## 2013-04-19 ENCOUNTER — Encounter: Payer: Self-pay | Admitting: Family Medicine

## 2013-04-19 VITALS — BP 88/48 | HR 49 | Temp 96.9°F | Ht 67.0 in | Wt 175.0 lb

## 2013-04-19 DIAGNOSIS — H6093 Unspecified otitis externa, bilateral: Secondary | ICD-10-CM

## 2013-04-19 DIAGNOSIS — H6693 Otitis media, unspecified, bilateral: Secondary | ICD-10-CM

## 2013-04-19 DIAGNOSIS — H6690 Otitis media, unspecified, unspecified ear: Secondary | ICD-10-CM

## 2013-04-19 DIAGNOSIS — I9589 Other hypotension: Secondary | ICD-10-CM

## 2013-04-19 DIAGNOSIS — I952 Hypotension due to drugs: Secondary | ICD-10-CM

## 2013-04-19 DIAGNOSIS — H60399 Other infective otitis externa, unspecified ear: Secondary | ICD-10-CM

## 2013-04-19 MED ORDER — NEOMYCIN-POLYMYXIN-HC 3.5-10000-1 OT SOLN: 3.0000 [drp] | Freq: Four times a day (QID) | OTIC | Status: DC

## 2013-04-19 MED ORDER — AZITHROMYCIN 250 MG PO TABS: ORAL_TABLET | ORAL | Status: DC

## 2013-04-19 NOTE — Progress Notes (Signed)
   Subjective:    Patient ID: Courtney Allen, female    DOB: 10/10/1965, 47 y.o.   MRN: [id number]  HPI EAR PAIN Location:  L ear  Description: l ear pain and discomfort  Onset:  3-4 days  Modifying factors: none   Symptoms  Sensation of fullness: yes Ear discharge: no URI symptoms: minimal  Fever: ni Tinnitus:no   Dizziness:no   Hearing loss:no   Toothache: no Rashes or lesions: no Facial muscle weakness: no  Red Flags Recent trauma: no PMH prior ear surgery:  no Diabetes or Immunosuppresion: no    Pt also recently started on clonidine for attention issues per husband.  Pt has had markedly low BPs at home with systolics running into the 80s-90s.  No weakness, syncopal episodes to date.  Feels like medication is not helping and is making BP drop too low.    Review of Systems  All other systems reviewed and are negative.       Objective:   Physical Exam  Constitutional: She appears well-developed and well-nourished.  HENT:  Head: Normocephalic and atraumatic.  Bilateral ear canal tenderness to otoscopic evaluation Mild L ear canal erythema and TM bulging +nasal erythema, rhinorrhea bilaterally, + post oropharyngeal erythema    Eyes: Conjunctivae are normal. Pupils are equal, round, and reactive to light.  Neck: Normal range of motion.  Cardiovascular: Normal rate and regular rhythm.   Pulmonary/Chest: Effort normal and breath sounds normal.  Abdominal: Soft.  Musculoskeletal: Normal range of motion.  Neurological: She is alert.  Skin: Skin is warm.          Assessment & Plan:  OE (otitis externa), bilateral - Plan: neomycin-polymyxin-hydrocortisone (CORTISPORIN) otic solution  Otitis, bilateral - Plan: azithromycin (ZITHROMAX) 250 MG tablet  Hypotension due to drugs  Will place on cortisporin and zpak  Discussed supportive care and ENT red flags.  Follow up as needed.  Discussed gradual taper of clonidine as to avoid rebound hypertension.    Follow up with neuro/psych to discuss this issue.

## 2013-05-01 ENCOUNTER — Telehealth: Payer: Self-pay | Admitting: Nurse Practitioner

## 2013-05-01 NOTE — Telephone Encounter (Signed)
appt tomorrow with Courtney Allen for uti

## 2013-05-02 ENCOUNTER — Ambulatory Visit (INDEPENDENT_AMBULATORY_CARE_PROVIDER_SITE_OTHER): Payer: BC Managed Care – PPO | Admitting: General Practice

## 2013-05-02 ENCOUNTER — Encounter: Payer: Self-pay | Admitting: General Practice

## 2013-05-02 VITALS — BP 112/69 | HR 63 | Temp 97.0°F | Ht 67.0 in | Wt 175.0 lb

## 2013-05-02 DIAGNOSIS — R350 Frequency of micturition: Secondary | ICD-10-CM

## 2013-05-02 DIAGNOSIS — IMO0001 Reserved for inherently not codable concepts without codable children: Secondary | ICD-10-CM

## 2013-05-02 DIAGNOSIS — N390 Urinary tract infection, site not specified: Secondary | ICD-10-CM

## 2013-05-02 LAB — POCT URINALYSIS DIPSTICK
Bilirubin, UA: NEGATIVE
Glucose, UA: NEGATIVE
Ketones, UA: NEGATIVE
Nitrite, UA: NEGATIVE
Protein, UA: NEGATIVE
Spec Grav, UA: 1.02
Urobilinogen, UA: NEGATIVE
pH, UA: 6

## 2013-05-02 MED ORDER — NITROFURANTOIN MONOHYD MACRO 100 MG PO CAPS: 100.0000 mg | ORAL_CAPSULE | Freq: Two times a day (BID) | ORAL | Status: DC

## 2013-05-02 NOTE — Patient Instructions (Signed)
Urinary Tract Infection  Urinary tract infections (UTIs) can develop anywhere along your urinary tract. Your urinary tract is your body's drainage system for removing wastes and extra water. Your urinary tract includes two kidneys, two ureters, a bladder, and a urethra. Your kidneys are a pair of bean-shaped organs. Each kidney is about the size of your fist. They are located below your ribs, one on each side of your spine.  CAUSES  Infections are caused by microbes, which are microscopic organisms, including fungi, viruses, and bacteria. These organisms are so small that they can only be seen through a microscope. Bacteria are the microbes that most commonly cause UTIs.  SYMPTOMS   Symptoms of UTIs may vary by age and gender of the patient and by the location of the infection. Symptoms in young women typically include a frequent and intense urge to urinate and a painful, burning feeling in the bladder or urethra during urination. Older women and men are more likely to be tired, shaky, and weak and have muscle aches and abdominal pain. A fever may mean the infection is in your kidneys. Other symptoms of a kidney infection include pain in your back or sides below the ribs, nausea, and vomiting.  DIAGNOSIS  To diagnose a UTI, your caregiver will ask you about your symptoms. Your caregiver also will ask to provide a urine sample. The urine sample will be tested for bacteria and white blood cells. White blood cells are made by your body to help fight infection.  TREATMENT   Typically, UTIs can be treated with medication. Because most UTIs are caused by a bacterial infection, they usually can be treated with the use of antibiotics. The choice of antibiotic and length of treatment depend on your symptoms and the type of bacteria causing your infection.  HOME CARE INSTRUCTIONS   If you were prescribed antibiotics, take them exactly as your caregiver instructs you. Finish the medication even if you feel better after you  have only taken some of the medication.   Drink enough water and fluids to keep your urine clear or pale yellow.   Avoid caffeine, tea, and carbonated beverages. They tend to irritate your bladder.   Empty your bladder often. Avoid holding urine for long periods of time.   Empty your bladder before and after sexual intercourse.   After a bowel movement, women should cleanse from front to back. Use each tissue only once.  SEEK MEDICAL CARE IF:    You have back pain.   You develop a fever.   Your symptoms do not begin to resolve within 3 days.  SEEK IMMEDIATE MEDICAL CARE IF:    You have severe back pain or lower abdominal pain.   You develop chills.   You have nausea or vomiting.   You have continued burning or discomfort with urination.  MAKE SURE YOU:    Understand these instructions.   Will watch your condition.   Will get help right away if you are not doing well or get worse.  Document Released: 01/28/2005 Document Revised: 10/20/2011 Document Reviewed: 05/29/2011  [employer] Patient Information 2014 [employer].

## 2013-05-02 NOTE — Progress Notes (Signed)
   Subjective:    Patient ID: Courtney Allen, female    DOB: 1965-05-18, 47 y.o.   MRN: [id number]  Urinary Tract Infection  This is a new problem. The current episode started in the past 7 days. The problem has been gradually worsening. The quality of the pain is described as aching. There has been no fever. She is sexually active. There is no history of pyelonephritis. Associated symptoms include frequency and urgency. Pertinent negatives include no chills, flank pain, hematuria, nausea or vomiting. She has tried nothing for the symptoms. Her past medical history is significant for recurrent UTIs.  Patient as some mental retardation and dementia, but able to answer some simply questions.    Review of Systems  Constitutional: Negative for chills.  Respiratory: Negative for chest tightness and shortness of breath.   Cardiovascular: Negative for chest pain and palpitations.  Gastrointestinal: Negative for nausea and vomiting.  Genitourinary: Positive for urgency and frequency. Negative for hematuria and flank pain.       Objective:   Physical Exam  Constitutional: She appears well-developed and well-nourished.  Cardiovascular: Normal rate, regular rhythm and normal heart sounds.   Pulmonary/Chest: Effort normal and breath sounds normal. No respiratory distress. She exhibits no tenderness.  Abdominal: Soft. Bowel sounds are normal. She exhibits no distension. There is tenderness in the suprapubic area.  Neurological: She is alert.  Skin: Skin is warm and dry. No rash noted.  Psychiatric: She has a normal mood and affect.     Results for orders placed in visit on 05/02/13  POCT URINALYSIS DIPSTICK      Result Value Range   Color, UA yellow     Clarity, UA clear     Glucose, UA neg     Bilirubin, UA neg     Ketones, UA neg     Spec Grav, UA 1.020     Blood, UA trace     pH, UA 6.0     Protein, UA neg     Urobilinogen, UA negative     Nitrite, UA neg     Leukocytes, UA Trace           Assessment & Plan  1. Frequency  - POCT urinalysis dipstick  2. UTI (urinary tract infection)  - nitrofurantoin, macrocrystal-monohydrate, (MACROBID) 100 MG capsule; Take 1 capsule (100 mg total) by mouth 2 (two) times daily.  Dispense: 20 capsule; Refill: 0 -Increase fluid intake AZO over the counter X2 days Frequent voiding Proper perineal hygiene RTO if symptoms worsen or unresolved, will refer to urology fo recurrent  uti Patient's husband verbalized understanding Coralie Keens, FNP-C

## 2013-05-05 ENCOUNTER — Ambulatory Visit: Payer: BC Managed Care – PPO | Admitting: Nurse Practitioner

## 2013-05-26 ENCOUNTER — Encounter: Payer: Self-pay | Admitting: Nurse Practitioner

## 2013-05-26 ENCOUNTER — Ambulatory Visit (INDEPENDENT_AMBULATORY_CARE_PROVIDER_SITE_OTHER): Payer: BC Managed Care – PPO | Admitting: Nurse Practitioner

## 2013-05-26 VITALS — BP 96/59 | HR 60 | Ht 67.0 in | Wt 178.0 lb

## 2013-05-26 DIAGNOSIS — G2571 Drug induced akathisia: Secondary | ICD-10-CM

## 2013-05-26 DIAGNOSIS — Q860 Fetal alcohol syndrome (dysmorphic): Secondary | ICD-10-CM

## 2013-05-26 DIAGNOSIS — F0281 Dementia in other diseases classified elsewhere with behavioral disturbance: Secondary | ICD-10-CM

## 2013-05-26 DIAGNOSIS — F02818 Dementia in other diseases classified elsewhere, unspecified severity, with other behavioral disturbance: Secondary | ICD-10-CM

## 2013-05-26 DIAGNOSIS — R3981 Functional urinary incontinence: Secondary | ICD-10-CM

## 2013-05-26 DIAGNOSIS — F819 Developmental disorder of scholastic skills, unspecified: Secondary | ICD-10-CM

## 2013-05-26 DIAGNOSIS — R259 Unspecified abnormal involuntary movements: Secondary | ICD-10-CM

## 2013-05-26 DIAGNOSIS — G2569 Other tics of organic origin: Secondary | ICD-10-CM

## 2013-05-26 DIAGNOSIS — F8189 Other developmental disorders of scholastic skills: Secondary | ICD-10-CM

## 2013-05-26 DIAGNOSIS — F71 Moderate intellectual disabilities: Secondary | ICD-10-CM

## 2013-05-26 DIAGNOSIS — H3552 Pigmentary retinal dystrophy: Secondary | ICD-10-CM

## 2013-05-26 DIAGNOSIS — IMO0002 Reserved for concepts with insufficient information to code with codable children: Secondary | ICD-10-CM

## 2013-05-26 MED ORDER — DONEPEZIL HCL 5 MG PO TABS: 5.0000 mg | ORAL_TABLET | Freq: Every evening | ORAL | Status: DC | PRN

## 2013-05-26 NOTE — Patient Instructions (Addendum)
Reduce Aricept to 5 mg  for memory problems.  Dr. Vickey Hugerohmeier will research the possible conditions and contact you for next step.  [facility]

## 2013-05-26 NOTE — Progress Notes (Addendum)
PATIENT: Courtney Allen DOB: 1965-07-28   REASON FOR VISIT: follow up for dementia HISTORY FROM: patient  HISTORY OF PRESENT ILLNESS: Courtney Allen is a 48 y.o. female Is seen here as a referral/ revisit from Dr. Gerome SamPoulus / Dr. Leonides CaveZelson:  This patient Has retinitis pigmentosa as does her brother, both were born to an alcoholic Mother.  The patient had learning disabilities, never graduated from [employer], limited reading and writing abilities- that have further decreased.  Her brother and herself are reportedly belligerent, impulsive, and have Trouble with anger management. Dr Leonides CaveZelson suspected early dementia and she needs to continue treatment with antipsychotics.  The patient begun treatment for a memory problems with Namenda and Aricept she developed compulsive scratching, repetitive motions and sudden restlessness occurred, with increased Chinaakatesia. She does not necessarily make eye contact. She is now on Remeron for sleep and to increase her appetite after she lost a lot of weight.  Her husband is afraid that she may need to move to an institution. She has no hallucinations, but rare delusions. She has never finished high school, she has 3 healthy, bright children with advanced education; she is married over 30 years.   Review of Systems:  Out of a complete 14 system review, the patient complains of only the following symptoms, and all other reviewed systems are negative.   REVIEW OF SYSTEMS: Full 14 system review of systems performed and notable only for:  Ear/Nose/Throat: runny nose Genitourinary: incontinenece Allergy/Immunology: frequent infections Neurological: memory loss Psychiatric: behavior problem, decreased concentration stiff With propulsive gait, small steps, repetitive non-verbal sounds, and vocal tics, with loud outbursts.  Difficulties with smooth movements.   ALLERGIES: No Known Allergies  HOME MEDICATIONS: Outpatient Prescriptions Prior to Visit    Medication Sig Dispense Refill  . buPROPion (WELLBUTRIN XL) 150 MG 24 hr tablet Take 1 tablet (150 mg total) by mouth daily.  90 tablet  3  . cholecalciferol (VITAMIN D) 1000 UNITS tablet Take 1,000 Units by mouth daily.      Marland Kitchen. donepezil (ARICEPT) 10 MG tablet Take 1 tablet (10 mg total) by mouth at bedtime as needed.  90 tablet  3  . hydrOXYzine (ATARAX/VISTARIL) 10 MG tablet Take 10 mg by mouth at bedtime as needed for anxiety.      . mirtazapine (REMERON) 15 MG tablet Take 2 tablets (30 mg total) by mouth at bedtime. Take one at bedtime po.  90 tablet  3   No facility-administered medications prior to visit.    PAST MEDICAL HISTORY: Past Medical History  Diagnosis Date  . Depression   . Bipolar I disorder, most recent episode (or current) unspecified   . Urinary incontinence   . Memory loss   . Developmental delay   . Dementia   . Mental retardation, moderate (I.Q. 35-49) 01/18/2013  . Fetal alcohol syndrome 01/18/2013  . General learning disability 01/18/2013  . Urinary incontinence due to cognitive impairment 01/18/2013  . Retinitis pigmentosa, both eyes 01/18/2013  . Dementia in conditions classified elsewhere with behavioral disturbance 01/18/2013    Superimposed alzheimer's dementia in a patient with known static encephalopathy.     PAST SURGICAL HISTORY: Past Surgical History  Procedure Laterality Date  . Tubal ligation    . Cesarean section    . Tonsillectomy      FAMILY HISTORY: Family History  Problem Relation Age of Onset  . Alcohol abuse Mother   . Emphysema Brother     SOCIAL HISTORY: History   Social History  .  Marital Status: Married    Spouse Name: [name]    Number of Children: 3  . Years of Education: 11   Occupational History  . disabled    Social History Main Topics  . Smoking status: Never Smoker   . Smokeless tobacco: Never Used  . Alcohol Use: No  . Drug Use: No  . Sexual Activity: Not on file   Other Topics Concern  . Not on file    Social History Narrative  . No narrative on file     PHYSICAL EXAM  Filed Vitals:   05/26/13 1105  BP: 96/59  Pulse: 60  Height: 5\' 7"  (1.702 m)  Weight: 178 lb (80.74 kg)   Body mass index is 27.87 kg/(m^2).  General: The patient is awake, alert and appears not in acute distress, but VERY RESTLESS.  The patient is well groomed.  Head: Normocephalic, atraumatic. Neck is supple. Mallampati 3 , neck circumference: 14.5 ,  Cardiovascular: Regular rate and rhythm, without murmurs or carotid bruit, and without distended neck veins.   Skin: Without evidence of edema, or rash  Trunk: propulsive.   Neurologic exam :  The patient is awake and alert, oriented to place and time.  Memory severly impaired, abnormal attention span & concentration ability.  MMSE 13/26 adjusted for the blind. Speech is non-fluent, she has repetitive nonverbal and verbal sounds with occasional loud outbursts, vocal tics.  Mood and affect are aloof, not depressed but "Uninvolved".  Cranial nerves:  Pupils are equal and briskly reactive to light. Retinitis pigmentosa. Extraocular movements in vertical and horizontal planes not intact a- due to limited tunnel vision. Hearing to finger rub intact. Facial sensation intact to fine touch. Facial motor strength is bilaterally reduced, droopy mouth - tongue and uvula move midline.  Motor exam: Decreased tone and normal muscle bulk. Sensory: Fine touch, pinprick and vibration were tested in all extremities and appear normal. Coordination:  Finger-to-nose maneuver tested and normal without evidence of ataxia, dysmetria or tremor.  Gait and station: Patient walks without assistive device -Stance is stable and normal. Tandem gait is deferred, the patient has propulsive tendency, falling risk higher when on incline or Walking downstairs. Gait and turns fragmented. Romberg testing is abnormal. Wide based.  Deep tendon reflexes: in the upper and lower extremities are symmetric  and intact.   DIAGNOSTIC DATA (LABS, IMAGING, TESTING) - I reviewed patient records, labs, notes, testing and imaging myself where available.   ASSESSMENT AND PLAN After physical and neurologic examination, review of laboratory studies, imaging, neurophysiology testing and pre-existing records, assessment is that of a multifactorial progressive cognitive decline.  The patient has some features of MRDD learning disability and retinitis related vision loss, genetic. Filum labium is present.  Coarse motor skills, Urinary incontinence , MMSE 13 -26 points.  Brain imaging 12-04-12 without evidence of NPH.  Alzheimer's superimposed on fetal alcohol syndrome is likely, plus psychiatric history and medication related changes in motor function.  The results of the patient's neuropsychological testing indicate that Dr. Leonides Cave considers the patient mildly mentally retarded based on attending a regular high school, in that she did not graduate from it. He noted her learning disabilities. Her EEG is normal. 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  PLAN: 1. Reduce Aricept to 5 mg daily as this has seemed to intensify akathisia and vocal tics. 2. Consultation with Geneticist is needed.  Melvyn Novas, MD 05/26/2013, 11:24 AM [facility] [address] [place], [place] [address] [phone]  Note: This document was prepared with digital dictation and possible smart phrase technology. Any transcriptional errors that result from this process are unintentional.   Addendum by Dr. Porfirio Mylar Dohmeier;  I spoke in detail with Mrs. and Mr. Seth Bake. after their visit with  Heide Guile  had concluded,   The patient has a brother with retinitis pigmentosa which has also affected her vision.  But she had learning disabilities of her school life, her brother apparently did not and neither her 3 children- the oldest two daughters attend college now. The  patient presents with  Abnormal  movements,  repetitive  movements , at times tic like - she can not suppress these-  and she presents with a quite significant dementia based on the Mini-Mental Status Examination. I would like for her to be evaluated by a dementia specialist at a tertiary care center with genetic testing as necessary. The patient's brain MRI , obtained 2014 before her referral to [facility] took place, is doing significant atrophy over the gyri, and especially concerned over the frontal lobes. There is no white matter disease. The hippocampi and mesial temporal lobe intact, this may reflect a frontal lobe dementia at early age. As I explained,  there are extraocular movement restrictions, and the patient is constantly moving, ongoing whilst in a seated position.

## 2013-05-29 NOTE — Addendum Note (Signed)
Addended by: Melvyn NovasHMEIER, Reathel Turi on: 05/29/2013 02:04 PM   Modules accepted: Orders, Level of Service

## 2013-06-09 ENCOUNTER — Telehealth: Payer: Self-pay | Admitting: Nurse Practitioner

## 2013-06-09 NOTE — Telephone Encounter (Signed)
Spoke with patient's husband and informed that a referral had been placed for Courtney Allen, gave him their number to call to sched appt.he verbalized understanding.

## 2013-06-09 NOTE — Telephone Encounter (Signed)
Called patient and left message for return call, concerning further testing. Patient does have a referral to Dr Gershon CraneLefkowitz,David

## 2013-06-14 ENCOUNTER — Other Ambulatory Visit: Payer: Self-pay | Admitting: General Practice

## 2013-06-14 ENCOUNTER — Other Ambulatory Visit (INDEPENDENT_AMBULATORY_CARE_PROVIDER_SITE_OTHER): Payer: BC Managed Care – PPO

## 2013-06-14 DIAGNOSIS — R350 Frequency of micturition: Secondary | ICD-10-CM

## 2013-06-14 LAB — POCT UA - MICROSCOPIC ONLY
Casts, Ur, LPF, POC: NEGATIVE
Crystals, Ur, HPF, POC: NEGATIVE
Mucus, UA: NEGATIVE
RBC, urine, microscopic: NEGATIVE
Yeast, UA: NEGATIVE

## 2013-06-14 LAB — POCT URINALYSIS DIPSTICK
Bilirubin, UA: NEGATIVE
Blood, UA: NEGATIVE
Glucose, UA: NEGATIVE
Ketones, UA: NEGATIVE
Leukocytes, UA: NEGATIVE
Nitrite, UA: NEGATIVE
Protein, UA: NEGATIVE
Spec Grav, UA: 1.015
Urobilinogen, UA: NEGATIVE
pH, UA: 6

## 2013-06-24 ENCOUNTER — Other Ambulatory Visit: Payer: Self-pay | Admitting: General Practice

## 2013-06-24 DIAGNOSIS — N390 Urinary tract infection, site not specified: Secondary | ICD-10-CM

## 2013-07-14 DIAGNOSIS — F0390 Unspecified dementia without behavioral disturbance: Secondary | ICD-10-CM

## 2013-07-14 DIAGNOSIS — F03918 Unspecified dementia, unspecified severity, with other behavioral disturbance: Secondary | ICD-10-CM | POA: Insufficient documentation

## 2013-07-14 DIAGNOSIS — H3552 Pigmentary retinal dystrophy: Secondary | ICD-10-CM | POA: Insufficient documentation

## 2013-07-14 DIAGNOSIS — F0391 Unspecified dementia with behavioral disturbance: Secondary | ICD-10-CM | POA: Insufficient documentation

## 2013-07-28 ENCOUNTER — Encounter: Payer: Self-pay | Admitting: General Practice

## 2013-07-28 ENCOUNTER — Ambulatory Visit (INDEPENDENT_AMBULATORY_CARE_PROVIDER_SITE_OTHER): Payer: BC Managed Care – PPO | Admitting: General Practice

## 2013-07-28 VITALS — BP 98/60 | HR 72 | Temp 97.0°F | Ht 67.0 in | Wt 175.0 lb

## 2013-07-28 DIAGNOSIS — Z0000 Encounter for general adult medical examination without abnormal findings: Secondary | ICD-10-CM

## 2013-07-28 DIAGNOSIS — E785 Hyperlipidemia, unspecified: Secondary | ICD-10-CM

## 2013-07-28 DIAGNOSIS — Z124 Encounter for screening for malignant neoplasm of cervix: Secondary | ICD-10-CM

## 2013-07-28 DIAGNOSIS — Z01419 Encounter for gynecological examination (general) (routine) without abnormal findings: Secondary | ICD-10-CM

## 2013-07-28 LAB — POCT CBC
Granulocyte percent: 52.8 %G (ref 37–80)
HCT, POC: 38.1 % (ref 37.7–47.9)
Hemoglobin: 12 g/dL — AB (ref 12.2–16.2)
Lymph, poc: 2.1 (ref 0.6–3.4)
MCH, POC: 29 pg (ref 27–31.2)
MCHC: 31.6 g/dL — AB (ref 31.8–35.4)
MCV: 91.8 fL (ref 80–97)
MPV: 8.3 fL (ref 0–99.8)
POC Granulocyte: 2.7 (ref 2–6.9)
POC LYMPH PERCENT: 40.7 %L (ref 10–50)
Platelet Count, POC: 176 10*3/uL (ref 142–424)
RBC: 4.2 M/uL (ref 4.04–5.48)
RDW, POC: 13.4 %
WBC: 5.2 10*3/uL (ref 4.6–10.2)

## 2013-07-28 NOTE — Progress Notes (Signed)
   Subjective:    Patient ID: Courtney Allen, female    DOB: April 16, 1966, 48 y.o.   MRN: [id number]  HPI Patient presents today for annual exam and pap. She is accompanied by her husband. Patient has dementia and mental retardation, but able to answer questions appropriately.     Review of Systems  Constitutional: Negative for fever and chills.  Respiratory: Negative for chest tightness and shortness of breath.   Cardiovascular: Negative for chest pain and palpitations.  Gastrointestinal: Negative for vomiting, abdominal pain, constipation and blood in stool.  Genitourinary: Negative for dysuria, frequency, hematuria and difficulty urinating.  Neurological: Negative for dizziness, weakness and headaches.  Psychiatric/Behavioral: Positive for behavioral problems.       Objective:   Physical Exam  Constitutional: She is oriented to person, place, and time. She appears well-developed and well-nourished.  HENT:  Head: Normocephalic and atraumatic.  Right Ear: External ear normal.  Left Ear: External ear normal.  Mouth/Throat: Oropharynx is clear and moist.  Eyes: Conjunctivae and EOM are normal. Pupils are equal, round, and reactive to light.  Neck: Normal range of motion. Neck supple. No thyromegaly present.  Cardiovascular: Normal rate, regular rhythm, normal heart sounds and intact distal pulses.   Pulmonary/Chest: Effort normal and breath sounds normal. No respiratory distress. She exhibits no tenderness. Right breast exhibits no inverted nipple, no mass, no nipple discharge, no skin change and no tenderness. Left breast exhibits no inverted nipple, no mass, no nipple discharge, no skin change and no tenderness. Breasts are symmetrical.  Abdominal: Soft. Bowel sounds are normal. She exhibits no distension.  Genitourinary: Vagina normal and uterus normal. No breast swelling, tenderness, discharge or bleeding. No labial fusion. There is no rash, tenderness, lesion or injury on the  right labia. There is no rash, tenderness, lesion or injury on the left labia. Uterus is not deviated, not enlarged, not fixed and not tender. Cervix exhibits no motion tenderness, no discharge and no friability. Right adnexum displays no mass, no tenderness and no fullness. Left adnexum displays no mass, no tenderness and no fullness. No erythema, tenderness or bleeding around the vagina. No foreign body around the vagina. No signs of injury around the vagina. No vaginal discharge found.  Lymphadenopathy:    She has no cervical adenopathy.  Neurological: She is alert and oriented to person, place, and time.  Skin: Skin is warm and dry.  Psychiatric:  Abnormal behavior           Assessment & Plan:  1. Encounter for routine gynecological examination - Pap IG w/ reflex to HPV when ASC-U  2. Annual physical exam  - POCT CBC - CMP14+EGFR  3. Hyperlipidemia  - Lipid panel -Continue all current medications Labs pending F/u in 3 months Discussed benefits of regular exercise and healthy eating Patient and husband verbalized understanding Erby Pian, FNP-C

## 2013-07-28 NOTE — Patient Instructions (Signed)

## 2013-07-29 LAB — CMP14+EGFR
ALT: 13 IU/L (ref 0–32)
AST: 13 IU/L (ref 0–40)
Albumin/Globulin Ratio: 2.3 (ref 1.1–2.5)
Albumin: 4.6 g/dL (ref 3.5–5.5)
Alkaline Phosphatase: 78 IU/L (ref 39–117)
BUN/Creatinine Ratio: 21 (ref 9–23)
BUN: 18 mg/dL (ref 6–24)
CO2: 24 mmol/L (ref 18–29)
Calcium: 9.2 mg/dL (ref 8.7–10.2)
Chloride: 105 mmol/L (ref 97–108)
Creatinine, Ser: 0.87 mg/dL (ref 0.57–1.00)
GFR calc Af Amer: 91 mL/min/{1.73_m2} (ref >59)
GFR calc non Af Amer: 79 mL/min/{1.73_m2} (ref >59)
Globulin, Total: 2 g/dL (ref 1.5–4.5)
Glucose: 86 mg/dL (ref 65–99)
Potassium: 4.3 mmol/L (ref 3.5–5.2)
Sodium: 144 mmol/L (ref 134–144)
Total Bilirubin: 0.3 mg/dL (ref 0.0–1.2)
Total Protein: 6.6 g/dL (ref 6.0–8.5)

## 2013-07-29 LAB — LIPID PANEL
Chol/HDL Ratio: 3.5 ratio units (ref 0.0–4.4)
Cholesterol, Total: 189 mg/dL (ref 100–199)
HDL: 54 mg/dL (ref >39)
LDL Calculated: 114 mg/dL — ABNORMAL HIGH (ref 0–99)
Triglycerides: 105 mg/dL (ref 0–149)
VLDL Cholesterol Cal: 21 mg/dL (ref 5–40)

## 2013-08-01 LAB — PAP IG W/ RFLX HPV ASCU: PAP Smear Comment: 0

## 2013-11-24 ENCOUNTER — Encounter: Payer: Self-pay | Admitting: General Practice

## 2013-11-24 ENCOUNTER — Ambulatory Visit (INDEPENDENT_AMBULATORY_CARE_PROVIDER_SITE_OTHER): Payer: BC Managed Care – PPO | Admitting: General Practice

## 2013-11-24 VITALS — BP 105/71 | HR 52 | Temp 96.8°F | Ht 67.0 in | Wt 183.2 lb

## 2013-11-24 DIAGNOSIS — R300 Dysuria: Secondary | ICD-10-CM

## 2013-11-24 DIAGNOSIS — N390 Urinary tract infection, site not specified: Secondary | ICD-10-CM

## 2013-11-24 LAB — POCT URINALYSIS DIPSTICK
Bilirubin, UA: NEGATIVE
Glucose, UA: NEGATIVE
Ketones, UA: NEGATIVE
Nitrite, UA: POSITIVE
Protein, UA: NEGATIVE
Spec Grav, UA: >=1.03
Urobilinogen, UA: NEGATIVE
pH, UA: 7.5

## 2013-11-24 MED ORDER — CIPROFLOXACIN HCL 500 MG PO TABS: 500.0000 mg | ORAL_TABLET | Freq: Two times a day (BID) | ORAL | Status: DC

## 2013-11-24 NOTE — Patient Instructions (Signed)

## 2013-11-24 NOTE — Progress Notes (Signed)
   Subjective:    Patient ID: Courtney Allen, female    DOB: Jan 08, 1966, 48 y.o.   MRN: [id number]  Dysuria  This is a recurrent problem. The problem has been gradually worsening. The pain is at a severity of 0/10. She is sexually active. There is no history of pyelonephritis. Associated symptoms include flank pain, frequency and urgency. Pertinent negatives include no chills, hematuria or nausea. She has tried nothing for the symptoms. Her past medical history is significant for recurrent UTIs.  Patient has a history of fetal alcohol syndrome and dementia. She is accompanied by her husband. Courtney Allen seems to answer questions appropriately with yes and no, that were asked about urinary symptoms. Patient's husband reports she has been seen by a urologist, who discussed urological procedure. Patient's husband reports they have declined to have procedure at this time.     Review of Systems  Constitutional: Negative for fever and chills.  Respiratory: Negative for chest tightness and shortness of breath.   Cardiovascular: Negative for chest pain and palpitations.  Gastrointestinal: Negative for nausea and abdominal pain.  Genitourinary: Positive for dysuria, urgency, frequency and flank pain. Negative for hematuria.  Neurological: Negative for dizziness and weakness.       Objective:   Physical Exam  Constitutional: She is oriented to person, place, and time. She appears well-developed and well-nourished.  Cardiovascular: Normal rate, regular rhythm and normal heart sounds.   Pulmonary/Chest: Effort normal and breath sounds normal. No respiratory distress. She exhibits no tenderness.  Abdominal: She exhibits no distension. There is no tenderness.  Neurological: She is alert and oriented to person, place, and time.  Skin: Skin is warm and dry.  Psychiatric: She has a normal mood and affect.      Results for orders placed in visit on 11/24/13  POCT URINALYSIS DIPSTICK      Result Value  Ref Range   Color, UA yellow     Clarity, UA cloudy     Glucose, UA neg     Bilirubin, UA neg     Ketones, UA neg     Spec Grav, UA >=1.030     Blood, UA trace     pH, UA 7.5     Protein, UA neg     Urobilinogen, UA negative     Nitrite, UA pos     Leukocytes, UA moderate (2+)         Assessment & Plan:  1. Dysuria  - POCT urinalysis dipstick  2. Urinary tract infection, site not specified  - ciprofloxacin (CIPRO) 500 MG tablet; Take 1 tablet (500 mg total) by mouth 2 (two) times daily.  Dispense: 20 tablet; Refill: 0 - Urine culture -discussed and provided patient educational material, UTI -discussed importance of proper perineal care  -May return to office if symptoms worsen  -Will need to follow up with urologist if symptoms unresolved  -Patient husband verbalized understanding and in agreement Courtney Allen E. Darly Massi, FNP-C

## 2013-11-26 LAB — URINE CULTURE

## 2013-12-28 ENCOUNTER — Other Ambulatory Visit (INDEPENDENT_AMBULATORY_CARE_PROVIDER_SITE_OTHER): Payer: BC Managed Care – PPO

## 2013-12-28 DIAGNOSIS — F0281 Dementia in other diseases classified elsewhere with behavioral disturbance: Secondary | ICD-10-CM

## 2013-12-28 DIAGNOSIS — Z79899 Other long term (current) drug therapy: Secondary | ICD-10-CM

## 2013-12-28 DIAGNOSIS — F02818 Dementia in other diseases classified elsewhere, unspecified severity, with other behavioral disturbance: Secondary | ICD-10-CM

## 2013-12-29 LAB — LIPID PANEL
Chol/HDL Ratio: 3.8 ratio units (ref 0.0–4.4)
Cholesterol, Total: 178 mg/dL (ref 100–199)
HDL: 47 mg/dL (ref >39)
LDL Calculated: 111 mg/dL — ABNORMAL HIGH (ref 0–99)
Triglycerides: 101 mg/dL (ref 0–149)
VLDL Cholesterol Cal: 20 mg/dL (ref 5–40)

## 2013-12-29 LAB — CMP14+EGFR
ALT: 8 IU/L (ref 0–32)
AST: 10 IU/L (ref 0–40)
Albumin/Globulin Ratio: 2.1 (ref 1.1–2.5)
Albumin: 4.2 g/dL (ref 3.5–5.5)
Alkaline Phosphatase: 77 IU/L (ref 39–117)
BUN/Creatinine Ratio: 15 (ref 9–23)
BUN: 13 mg/dL (ref 6–24)
CO2: 23 mmol/L (ref 18–29)
Calcium: 9.3 mg/dL (ref 8.7–10.2)
Chloride: 104 mmol/L (ref 97–108)
Creatinine, Ser: 0.84 mg/dL (ref 0.57–1.00)
GFR calc Af Amer: 94 mL/min/{1.73_m2} (ref >59)
GFR calc non Af Amer: 82 mL/min/{1.73_m2} (ref >59)
Globulin, Total: 2 g/dL (ref 1.5–4.5)
Glucose: 85 mg/dL (ref 65–99)
Potassium: 4.3 mmol/L (ref 3.5–5.2)
Sodium: 142 mmol/L (ref 134–144)
Total Bilirubin: 0.3 mg/dL (ref 0.0–1.2)
Total Protein: 6.2 g/dL (ref 6.0–8.5)

## 2013-12-29 LAB — CBC WITH DIFFERENTIAL
Basophils Absolute: 0 10*3/uL (ref 0.0–0.2)
Basos: 1 %
Eos: 2 %
Eosinophils Absolute: 0.1 10*3/uL (ref 0.0–0.4)
HCT: 36.5 % (ref 34.0–46.6)
Hemoglobin: 12.4 g/dL (ref 11.1–15.9)
Immature Grans (Abs): 0 10*3/uL (ref 0.0–0.1)
Immature Granulocytes: 0 %
Lymphocytes Absolute: 1.7 10*3/uL (ref 0.7–3.1)
Lymphs: 45 %
MCH: 31.2 pg (ref 26.6–33.0)
MCHC: 34 g/dL (ref 31.5–35.7)
MCV: 92 fL (ref 79–97)
Monocytes Absolute: 0.3 10*3/uL (ref 0.1–0.9)
Monocytes: 7 %
Neutrophils Absolute: 1.7 10*3/uL (ref 1.4–7.0)
Neutrophils Relative %: 45 %
Platelets: 172 10*3/uL (ref 150–379)
RBC: 3.98 x10E6/uL (ref 3.77–5.28)
RDW: 13.6 % (ref 12.3–15.4)
WBC: 3.7 10*3/uL (ref 3.4–10.8)

## 2013-12-29 LAB — URINALYSIS, ROUTINE W REFLEX MICROSCOPIC
Bilirubin, UA: NEGATIVE
Glucose, UA: NEGATIVE
Ketones, UA: NEGATIVE
Nitrite, UA: NEGATIVE
Protein, UA: NEGATIVE
RBC, UA: NEGATIVE
Specific Gravity, UA: 1.023 (ref 1.005–1.030)
Urobilinogen, Ur: 0.2 mg/dL (ref 0.0–1.9)
pH, UA: 6.5 (ref 5.0–7.5)

## 2013-12-29 LAB — THYROID PANEL WITH TSH
Free Thyroxine Index: 1.2 (ref 1.2–4.9)
T3 Uptake Ratio: 29 % (ref 24–39)
T4, Total: 4.2 ug/dL — ABNORMAL LOW (ref 4.5–12.0)
TSH: 3.28 u[IU]/mL (ref 0.450–4.500)

## 2013-12-29 LAB — MICROSCOPIC EXAMINATION

## 2013-12-29 LAB — CARBAMAZEPINE, FREE AND TOTAL: Carbamazepine, Free: 0.8 ug/mL (ref 0.6–4.2)

## 2014-01-03 ENCOUNTER — Telehealth: Payer: Self-pay | Admitting: Family Medicine

## 2014-01-03 NOTE — Telephone Encounter (Signed)
Message copied by Azalee Course on Wed Jan 03, 2014  2:11 PM ------      Message from: Ernestina Penna      Created: Sat Dec 30, 2013  7:58 AM       I am not sure who ordered this lab work. Make sure that the ordering provider gets a copy of this.      The blood sugar kidney function tests electrolytes and liver function tests were all within normal limit      The CBC had a normal white blood cell count, a good hemoglobin of 12.4 and adequate platelet count      On a traditional lipid panel the triglycerides were good but the LDL C. cholesterol was elevated at 111.------ if the patient is seeing [place] she should be scheduled for an appointment with the clinical pharmacist to discuss cholesterol management with diet and exercise      Thyroid function tests were within normal limits      The carbamazepine level was within normal      The urinalysis had 6-10 WBC-----if the patient is having symptoms this may need to be treated. Please confirm with her.+++++ ------

## 2014-01-04 NOTE — Telephone Encounter (Signed)
Pt husband aware of results and making appt with Pharm-D

## 2014-01-25 ENCOUNTER — Ambulatory Visit (INDEPENDENT_AMBULATORY_CARE_PROVIDER_SITE_OTHER): Payer: BC Managed Care – PPO | Admitting: Pharmacist

## 2014-01-25 ENCOUNTER — Encounter: Payer: Self-pay | Admitting: Pharmacist

## 2014-01-25 VITALS — BP 101/70 | HR 80 | Ht 67.0 in | Wt 182.0 lb

## 2014-01-25 DIAGNOSIS — E7800 Pure hypercholesterolemia, unspecified: Secondary | ICD-10-CM

## 2014-01-25 NOTE — Progress Notes (Signed)
Lipid Clinic Consultation  Chief Complaint:   Chief Complaint  Patient presents with  . Hyperlipidemia     HPI:  Patient with LDL of 111.  She has early onset dementia and husband is present with her today.  She has a strong family history of CAD but she herself has a low calculated risk of ASCVD.   She has never taking medication for lipids before and is not following any specific diet with regards to her health.      Component Value Date/Time   LDL  111 12/29/2013   HDL 47 12/29/2013   CHOL 178 12/29/2013   TRIG 101 12/29/2013     CHD/CHF Risk Equivalents:  none AHA ASCVD risk = 0.8% NCEP Risk Factors Present:  family history  Secondary cause of hyperlipidemia present: medications - seroquel associated with increased LDL and Tg Low fat diet followed?  No -   Low carb diet followed?  No -   Exercise?  No -    Filed Vitals:   01/25/14 1029  BP: 101/70  Pulse: 80   Filed Weights   01/25/14 1029  Weight: 182 lb (82.555 kg)   Body mass index is 28.5 kg/(m^2).   Recommendations: Not a candidate per AHA guidelines for pharmacotherapy at this time.  Discussed mediterranean diet - increase fresh fruits and vegetables, limit red meat and unsaturated and trans fat.  Increse whole grains and moderate amounts of monounsaturated fats. Recheck Lipid Panel:  6 months Other labs needed:  CMP  Time spent counseling patient:  30 minutes   Referring Provider:  Christell Constant   PharmD:  Henrene Pastor, PHARMD, CPP

## 2014-02-16 ENCOUNTER — Ambulatory Visit (INDEPENDENT_AMBULATORY_CARE_PROVIDER_SITE_OTHER): Payer: BC Managed Care – PPO | Admitting: Family Medicine

## 2014-02-16 ENCOUNTER — Encounter: Payer: Self-pay | Admitting: Family Medicine

## 2014-02-16 VITALS — BP 83/56 | HR 77 | Temp 97.0°F | Ht 67.0 in | Wt 179.0 lb

## 2014-02-16 DIAGNOSIS — F0390 Unspecified dementia without behavioral disturbance: Secondary | ICD-10-CM

## 2014-02-16 DIAGNOSIS — N390 Urinary tract infection, site not specified: Secondary | ICD-10-CM

## 2014-02-16 DIAGNOSIS — R300 Dysuria: Secondary | ICD-10-CM

## 2014-02-16 LAB — POCT UA - MICROSCOPIC ONLY
Casts, Ur, LPF, POC: NEGATIVE
Crystals, Ur, HPF, POC: NEGATIVE
Mucus, UA: NEGATIVE
RBC, urine, microscopic: NEGATIVE
Yeast, UA: NEGATIVE

## 2014-02-16 LAB — POCT URINALYSIS DIPSTICK
Bilirubin, UA: NEGATIVE
Blood, UA: NEGATIVE
Glucose, UA: NEGATIVE
Ketones, UA: NEGATIVE
Nitrite, UA: NEGATIVE
Protein, UA: NEGATIVE
Spec Grav, UA: 1.01
Urobilinogen, UA: NEGATIVE
pH, UA: 6.5

## 2014-02-16 MED ORDER — CIPROFLOXACIN HCL 500 MG PO TABS: 500.0000 mg | ORAL_TABLET | Freq: Two times a day (BID) | ORAL | Status: DC

## 2014-02-16 MED ORDER — NITROFURANTOIN MONOHYD MACRO 100 MG PO CAPS: ORAL_CAPSULE | ORAL | Status: DC

## 2014-02-16 NOTE — Progress Notes (Signed)
Subjective:    Patient ID: Courtney Allen, female    DOB: 09-09-1965, 48 y.o.   MRN: [id number]  HPI This 48 y.o. female presents for evaluation of lower back pain, urinary frequency, and dysuria. She has dementia and has been having frequent UTI's one every 2 months.  Her husband  Is here with her and states she doesn't clean herself right and he will try to bathe and clean her But she still gets the UTI's frequently.  He states he hates to clean her and he will try to help her with hygiene since it is not working.  According to husband, she is getting worse with her dementia and confusion.  He is working full time night shift.  He states she sleeps well and is not wandering. Her husband has to give history.  She has seen urology in past.  Review of Systems C/o dysuria and confusion.   No chest pain, SOB, HA, dizziness, vision change, N/V, diarrhea, constipation, myalgias, arthralgias or rash.  Objective:   Physical Exam Vital signs noted  Well developed well nourished confused female.  HEENT - Head atraumatic Normocephalic                Eyes - PERRLA, Conjuctiva - clear Sclera- Clear EOMI                Ears - EAC's Wnl TM's Wnl Gross Hearing WNL                Nose - Nares patent                 Throat - oropharanx wnl Respiratory - Lungs CTA bilateral Cardiac - RRR S1 and S2 without murmur GI - Abdomen soft Nontender and bowel sounds active x 4 Extremities - No edema. Neuro - She is not oriented to person, place, or time and repeats part of questions back to me. She is unable to do MMSE.    Results for orders placed in visit on 02/16/14  POCT URINALYSIS DIPSTICK      Result Value Ref Range   Color, UA yellow     Clarity, UA clear     Glucose, UA neg     Bilirubin, UA neg     Ketones, UA neg     Spec Grav, UA 1.010     Blood, UA neg     pH, UA 6.5     Protein, UA neg     Urobilinogen, UA negative     Nitrite, UA neg     Leukocytes, UA Trace    POCT UA -  MICROSCOPIC ONLY      Result Value Ref Range   WBC, Ur, HPF, POC 10-15w/clue cell     RBC, urine, microscopic neg     Bacteria, U Microscopic mod     Mucus, UA neg     Epithelial cells, urine per micros occ     Crystals, Ur, HPF, POC neg     Casts, Ur, LPF, POC neg     Yeast, UA neg         Assessment & Plan:  Dysuria - Plan: POCT urinalysis dipstick, POCT UA - Microscopic Only, ciprofloxacin (CIPRO) 500 MG tablet  Urinary tract infection without hematuria, site unspecified - Plan: ciprofloxacin (CIPRO) 500 MG tablet  Chronic UTI - Plan: nitrofurantoin, macrocrystal-monohydrate, (MACROBID) 100 MG capsule  [facility] - [facility] and see if she can get some help.  Recommend Nurse and Aide.  Eval and treat.  Deatra CanterWilliam J Ilissa Rosner FNP

## 2014-04-30 ENCOUNTER — Encounter: Payer: Self-pay | Admitting: Physician Assistant

## 2014-04-30 ENCOUNTER — Ambulatory Visit (INDEPENDENT_AMBULATORY_CARE_PROVIDER_SITE_OTHER): Payer: BC Managed Care – PPO | Admitting: Physician Assistant

## 2014-04-30 VITALS — BP 95/64 | HR 56 | Temp 97.5°F | Ht 67.0 in | Wt 177.0 lb

## 2014-04-30 DIAGNOSIS — R300 Dysuria: Secondary | ICD-10-CM

## 2014-04-30 LAB — POCT URINALYSIS DIPSTICK
Bilirubin, UA: NEGATIVE
Blood, UA: NEGATIVE
Glucose, UA: NEGATIVE
Ketones, UA: NEGATIVE
Leukocytes, UA: NEGATIVE
Nitrite, UA: NEGATIVE
Protein, UA: NEGATIVE
Spec Grav, UA: 1.01
Urobilinogen, UA: NEGATIVE
pH, UA: 5

## 2014-04-30 LAB — POCT UA - MICROSCOPIC ONLY
Casts, Ur, LPF, POC: NEGATIVE
Crystals, Ur, HPF, POC: NEGATIVE
Yeast, UA: NEGATIVE

## 2014-04-30 MED ORDER — METRONIDAZOLE 500 MG PO TABS: 500.0000 mg | ORAL_TABLET | Freq: Two times a day (BID) | ORAL | Status: DC

## 2014-04-30 NOTE — Progress Notes (Signed)
Patient ID: Courtney Allen, female   DOB: 04-Oct-1965, 48 y.o.   MRN: [id number] Pt was able to submit urine before leaving UA was neagtive but did show evidence of clue cells Flagyl 500mg  bid x 1 wk F/U if sx cont

## 2014-04-30 NOTE — Addendum Note (Signed)
Addended by: Prescott GumLAND, Jamirah Zelaya M on: 04/30/2014 11:11 AM   Modules accepted: Kipp BroodSmartSet

## 2014-04-30 NOTE — Addendum Note (Signed)
Addended by: Prescott GumLAND, Pershing Skidmore M on: 04/30/2014 11:07 AM   Modules accepted: Kipp BroodSmartSet

## 2014-04-30 NOTE — Addendum Note (Signed)
Addended by: Inis SizerWEBSTER, WILLIAM L on: 04/30/2014 11:32 AM   Modules accepted: Orders

## 2014-04-30 NOTE — Progress Notes (Signed)
Subjective:     Patient ID: Courtney Allen, female   DOB: [birth year], 48 y.o.   MRN: [id number]  HPI Pt with a several day hx of polyuria and dysuria More recent R lower back pain Pt with hx of UTI Denies any fever/chills No OTC meds for sx   Review of Systems  Gastrointestinal: Negative for nausea, vomiting and abdominal pain.  Endocrine: Positive for polyuria.  Genitourinary: Positive for urgency, frequency and flank pain. Negative for hematuria.       Objective:   Physical Exam  Constitutional: She appears well-developed and well-nourished.  Cardiovascular: Normal rate, regular rhythm and normal heart sounds.   Abdominal: Soft. Bowel sounds are normal. She exhibits no distension and no mass. There is tenderness. There is no rebound and no guarding.  + CVAT  R side  Vitals reviewed.      Assessment:     Dysuria    Plan:     Pt unable to give urine specimen in the office today Urine cup given to pt Husband/pt will bring specimen back and will tx at that time

## 2014-04-30 NOTE — Patient Instructions (Signed)

## 2014-05-02 ENCOUNTER — Telehealth: Payer: Self-pay | Admitting: Physician Assistant

## 2014-05-02 NOTE — Telephone Encounter (Signed)
[facility] - please address meds and possible interaction

## 2014-05-02 NOTE — Telephone Encounter (Signed)
Inform she should be able to take it w/o a problem If they want she could check with her neuro before starting

## 2014-05-03 NOTE — Telephone Encounter (Signed)
Patient husband aware

## 2014-07-24 ENCOUNTER — Encounter: Payer: Self-pay | Admitting: Nurse Practitioner

## 2014-07-24 ENCOUNTER — Ambulatory Visit (INDEPENDENT_AMBULATORY_CARE_PROVIDER_SITE_OTHER): Payer: BLUE CROSS/BLUE SHIELD | Admitting: Nurse Practitioner

## 2014-07-24 VITALS — BP 94/58 | HR 61 | Temp 97.1°F | Ht 67.0 in | Wt 179.0 lb

## 2014-07-24 DIAGNOSIS — R450 Nervousness: Secondary | ICD-10-CM | POA: Diagnosis not present

## 2014-07-24 DIAGNOSIS — Q860 Fetal alcohol syndrome (dysmorphic): Secondary | ICD-10-CM | POA: Diagnosis not present

## 2014-07-24 DIAGNOSIS — G4700 Insomnia, unspecified: Secondary | ICD-10-CM | POA: Diagnosis not present

## 2014-07-24 DIAGNOSIS — F0391 Unspecified dementia with behavioral disturbance: Secondary | ICD-10-CM

## 2014-07-24 DIAGNOSIS — R3981 Functional urinary incontinence: Secondary | ICD-10-CM | POA: Diagnosis not present

## 2014-07-24 DIAGNOSIS — Z23 Encounter for immunization: Secondary | ICD-10-CM

## 2014-07-24 DIAGNOSIS — F03918 Unspecified dementia, unspecified severity, with other behavioral disturbance: Secondary | ICD-10-CM

## 2014-07-24 DIAGNOSIS — Z0000 Encounter for general adult medical examination without abnormal findings: Secondary | ICD-10-CM | POA: Diagnosis not present

## 2014-07-24 DIAGNOSIS — F71 Moderate intellectual disabilities: Secondary | ICD-10-CM

## 2014-07-24 DIAGNOSIS — G2571 Drug induced akathisia: Secondary | ICD-10-CM

## 2014-07-24 LAB — POCT CBC
Granulocyte percent: 56.3 %G (ref 37–80)
HCT, POC: 38.4 % (ref 37.7–47.9)
Hemoglobin: 12.3 g/dL (ref 12.2–16.2)
Lymph, poc: 2 (ref 0.6–3.4)
MCH, POC: 29.9 pg (ref 27–31.2)
MCHC: 32 g/dL (ref 31.8–35.4)
MCV: 93.6 fL (ref 80–97)
MPV: 8.3 fL (ref 0–99.8)
POC Granulocyte: 3 (ref 2–6.9)
POC LYMPH PERCENT: 36.5 %L (ref 10–50)
Platelet Count, POC: 206 10*3/uL (ref 142–424)
RBC: 4.1 M/uL (ref 4.04–5.48)
RDW, POC: 12.7 %
WBC: 5.4 10*3/uL (ref 4.6–10.2)

## 2014-07-24 MED ORDER — BUPROPION HCL ER (XL) 150 MG PO TB24: 150.0000 mg | ORAL_TABLET | Freq: Every day | ORAL | Status: DC

## 2014-07-24 MED ORDER — MIRTAZAPINE 15 MG PO TABS: 30.0000 mg | ORAL_TABLET | Freq: Every day | ORAL | Status: DC

## 2014-07-24 MED ORDER — CARBAMAZEPINE 200 MG PO TABS: 200.0000 mg | ORAL_TABLET | Freq: Two times a day (BID) | ORAL | Status: DC

## 2014-07-24 MED ORDER — QUETIAPINE FUMARATE 25 MG PO TABS: 25.0000 mg | ORAL_TABLET | Freq: Every day | ORAL | Status: DC

## 2014-07-24 NOTE — Progress Notes (Signed)
Subjective:    Patient ID: Courtney Allen, female    DOB: [birth year], 49 y.o.   MRN: [id number]  HPI The patient presents for a complete physical. She complains of intermittent left elbow pain-per her husband she has a small scaly spot on her elbow which she complains of every once in a while for the past several months but states it feels better when he applies anti itch cream to it. She was diagnosed with early onset dementia and he states she has noticably declined over the last year-last year she was usually able to clothe and shower herself-recently he consistently has to help her shower and dress.   Patient Active Problem List   Diagnosis Date Noted  . Mental retardation, moderate (I.Q. 35-49) 01/18/2013  . Fetal alcohol syndrome 01/18/2013  . General learning disability 01/18/2013  . Urinary incontinence due to cognitive impairment 01/18/2013  . Retinitis pigmentosa, both eyes 01/18/2013  . Dementia in conditions classified elsewhere with behavioral disturbance(294.11) 01/18/2013   Outpatient Encounter Prescriptions as of 07/24/2014  Medication Sig  . buPROPion (WELLBUTRIN XL) 150 MG 24 hr tablet Take 1 tablet (150 mg total) by mouth daily.  . carbamazepine (TEGRETOL) 200 MG tablet Take 1 tablet (200 mg total) by mouth 2 (two) times daily.  . cholecalciferol (VITAMIN D) 1000 UNITS tablet Take 1,000 Units by mouth daily.  . hydrOXYzine (ATARAX/VISTARIL) 10 MG tablet Take 10 mg by mouth at bedtime as needed for anxiety.  . mirtazapine (REMERON) 15 MG tablet Take 2 tablets (30 mg total) by mouth at bedtime. Take one at bedtime po.  Marland Kitchen QUEtiapine (SEROQUEL) 25 MG tablet Take 1 tablet (25 mg total) by mouth at bedtime.  . [DISCONTINUED] buPROPion (WELLBUTRIN XL) 150 MG 24 hr tablet Take 1 tablet (150 mg total) by mouth daily.  . [DISCONTINUED] buPROPion (WELLBUTRIN XL) 150 MG 24 hr tablet Take 1 tablet (150 mg total) by mouth daily.  . [DISCONTINUED] carbamazepine (TEGRETOL) 200 MG  tablet Take 200 mg by mouth 2 (two) times daily.  . [DISCONTINUED] mirtazapine (REMERON) 15 MG tablet Take 2 tablets (30 mg total) by mouth at bedtime. Take one at bedtime po.  . [DISCONTINUED] QUEtiapine (SEROQUEL) 25 MG tablet Take 25 mg by mouth at bedtime.  . [DISCONTINUED] metroNIDAZOLE (FLAGYL) 500 MG tablet Take 1 tablet (500 mg total) by mouth 2 (two) times daily.  . [DISCONTINUED] nitrofurantoin, macrocrystal-monohydrate, (MACROBID) 100 MG capsule Take on po qd     Review of Systems  Constitutional: Negative.  Negative for activity change, appetite change, fatigue and unexpected weight change.  HENT: Negative.  Negative for congestion, rhinorrhea and sore throat.   Respiratory: Negative.  Negative for shortness of breath.   Cardiovascular: Negative.  Negative for chest pain, palpitations and leg swelling.  Gastrointestinal: Negative.   Endocrine: Negative.   Genitourinary: Negative.        Incontinence related to cognitive impairment.   Musculoskeletal: Negative.   Skin:       Small lesion to left elbow-scratches frequently at home and reports pain.    Neurological: Negative.   Psychiatric/Behavioral: Positive for behavioral problems, confusion and sleep disturbance.  All other systems reviewed and are negative.      Objective:   Physical Exam  Constitutional: She appears well-developed and well-nourished. No distress.  HENT:  Head: Normocephalic and atraumatic.  Right Ear: External ear normal.  Left Ear: External ear normal.  Nose: Nose normal.  Mouth/Throat: Oropharynx is clear and moist. No oropharyngeal exudate.  Eyes:  Conjunctivae and EOM are normal. Pupils are equal, round, and reactive to light.  Neck: Normal range of motion. Neck supple. No JVD present. No tracheal deviation present. No thyromegaly present.  Cardiovascular: Normal rate, regular rhythm, normal heart sounds and intact distal pulses.  Exam reveals no gallop and no friction rub.   No murmur  heard. Pulmonary/Chest: Effort normal and breath sounds normal. No respiratory distress. She has no wheezes. She has no rales.  Abdominal: Soft. Bowel sounds are normal. She exhibits no distension and no mass. There is no tenderness.  Musculoskeletal: Normal range of motion. She exhibits no edema or tenderness.  Lymphadenopathy:    She has no cervical adenopathy.  Neurological: She is alert. She has normal reflexes. No cranial nerve deficit.  Skin: Skin is warm and dry. Lesion (Small 1cm> dry flakey raised area-no exudate or erythema. ) noted. She is not diaphoretic.  Psychiatric: Her affect is labile. Her speech is delayed. She is slowed. Cognition and memory are impaired. She expresses inappropriate judgment.  Alert, oriented to self only. Cooperative, but has some difficulty following instructions. Will answer some simple questions with one word answers.      BP 94/58 mmHg  Pulse 61  Temp(Src) 97.1 F (36.2 C) (Oral)  Ht 5' 7"  (1.702 m)  Wt 179 lb (81.194 kg)  BMI 28.03 kg/m2      Assessment & Plan:  1. Fetal alcohol syndrome (dysmorphic)  2. Mental retardation, moderate (I.Q. 35-49)  3. Urinary incontinence due to cognitive impairment  4. Akathisia  5. Dementia with behavioral disturbance - carbamazepine (TEGRETOL) 200 MG tablet; Take 1 tablet (200 mg total) by mouth 2 (two) times daily.  Dispense: 60 tablet; Refill: 5 - QUEtiapine (SEROQUEL) 25 MG tablet; Take 1 tablet (25 mg total) by mouth at bedtime.  Dispense: 30 tablet; Refill: 5 - buPROPion (WELLBUTRIN XL) 150 MG 24 hr tablet; Take 1 tablet (150 mg total) by mouth daily.  Dispense: 90 tablet; Refill: 3  6. Annual physical exam - POCT CBC - CMP14+EGFR - NMR, lipoprofile - Thyroid Panel With TSH  7. Insomnia Bedtime routine, avoid electronics near bedtime, increase activity during the day.  - mirtazapine (REMERON) 15 MG tablet; Take 2 tablets (30 mg total) by mouth at bedtime. Take one at bedtime po.  Dispense:  90 tablet; Refill: 3   Administered Tdap Labs pending Health maintenance reviewed Diet and exercise encouraged Continue all meds Follow up  In 6 months   [place], FNP

## 2014-07-24 NOTE — Patient Instructions (Signed)

## 2014-07-25 LAB — CMP14+EGFR
ALT: 8 IU/L (ref 0–32)
AST: 12 IU/L (ref 0–40)
Albumin/Globulin Ratio: 1.9 (ref 1.1–2.5)
Albumin: 4.3 g/dL (ref 3.5–5.5)
Alkaline Phosphatase: 90 IU/L (ref 39–117)
BUN/Creatinine Ratio: 18 (ref 9–23)
BUN: 15 mg/dL (ref 6–24)
Bilirubin Total: 0.3 mg/dL (ref 0.0–1.2)
CO2: 21 mmol/L (ref 18–29)
Calcium: 9.8 mg/dL (ref 8.7–10.2)
Chloride: 103 mmol/L (ref 97–108)
Creatinine, Ser: 0.85 mg/dL (ref 0.57–1.00)
GFR calc Af Amer: 93 mL/min/{1.73_m2} (ref >59)
GFR calc non Af Amer: 81 mL/min/{1.73_m2} (ref >59)
Globulin, Total: 2.3 g/dL (ref 1.5–4.5)
Glucose: 87 mg/dL (ref 65–99)
Potassium: 4.1 mmol/L (ref 3.5–5.2)
Sodium: 141 mmol/L (ref 134–144)
Total Protein: 6.6 g/dL (ref 6.0–8.5)

## 2014-07-25 LAB — THYROID PANEL WITH TSH
Free Thyroxine Index: 1.8 (ref 1.2–4.9)
T3 Uptake Ratio: 28 % (ref 24–39)
T4, Total: 6.3 ug/dL (ref 4.5–12.0)
TSH: 2.1 u[IU]/mL (ref 0.450–4.500)

## 2014-07-25 LAB — NMR, LIPOPROFILE
Cholesterol: 194 mg/dL (ref 100–199)
HDL Cholesterol by NMR: 62 mg/dL (ref >39)
HDL Particle Number: 32.9 umol/L (ref >=30.5)
LDL Particle Number: 1303 nmol/L — ABNORMAL HIGH (ref <1000)
LDL Size: 21.3 nm (ref >20.5)
LDL-C: 112 mg/dL — ABNORMAL HIGH (ref 0–99)
LP-IR Score: 26 (ref <=45)
Small LDL Particle Number: 369 nmol/L (ref <=527)
Triglycerides by NMR: 99 mg/dL (ref 0–149)

## 2014-07-31 NOTE — Progress Notes (Signed)
Patient aware.

## 2014-08-13 ENCOUNTER — Telehealth: Payer: Self-pay | Admitting: Nurse Practitioner

## 2014-08-13 NOTE — Telephone Encounter (Signed)
Appointment given for wed at 9:40. Patient wanted an appointment around 10am.

## 2014-08-15 ENCOUNTER — Ambulatory Visit (INDEPENDENT_AMBULATORY_CARE_PROVIDER_SITE_OTHER): Payer: BLUE CROSS/BLUE SHIELD | Admitting: Family

## 2014-08-15 ENCOUNTER — Encounter: Payer: Self-pay | Admitting: Family

## 2014-08-15 VITALS — BP 97/65 | HR 68 | Temp 97.1°F | Ht 67.0 in | Wt 176.0 lb

## 2014-08-15 DIAGNOSIS — N390 Urinary tract infection, site not specified: Secondary | ICD-10-CM | POA: Diagnosis not present

## 2014-08-15 DIAGNOSIS — M545 Low back pain: Secondary | ICD-10-CM | POA: Diagnosis not present

## 2014-08-15 LAB — POCT URINALYSIS DIPSTICK
Bilirubin, UA: NEGATIVE
Blood, UA: NEGATIVE
Glucose, UA: NEGATIVE
Leukocytes, UA: NEGATIVE
Nitrite, UA: NEGATIVE
Spec Grav, UA: >=1.03
Urobilinogen, UA: NEGATIVE
pH, UA: 6

## 2014-08-15 LAB — POCT UA - MICROSCOPIC ONLY
Casts, Ur, LPF, POC: NEGATIVE
Crystals, Ur, HPF, POC: NEGATIVE
Mucus, UA: NEGATIVE
WBC, Ur, HPF, POC: NEGATIVE
Yeast, UA: NEGATIVE

## 2014-08-15 MED ORDER — SULFAMETHOXAZOLE-TRIMETHOPRIM 800-160 MG PO TABS: 1.0000 | ORAL_TABLET | Freq: Two times a day (BID) | ORAL | Status: DC

## 2014-08-15 NOTE — Patient Instructions (Signed)

## 2014-08-15 NOTE — Progress Notes (Signed)
   Subjective:    Patient ID: Courtney Allen, female    DOB: February 12, 1966, 49 y.o.   MRN: [id number]  Back Pain This is a new problem. The current episode started in the past 7 days. The problem occurs intermittently. The problem has been waxing and waning since onset. Pertinent negatives include no headaches.  Urinary Tract Infection  This is a new problem. The current episode started in the past 7 days. The problem occurs intermittently. The problem has been waxing and waning. The quality of the pain is described as burning. The pain is mild. There has been no fever. Associated symptoms include flank pain, frequency and urgency. Pertinent negatives include no chills, hematuria, hesitancy, nausea or vomiting. The treatment provided mild relief.      Review of Systems  Constitutional: Negative.  Negative for chills.  HENT: Negative.   Eyes: Negative.   Respiratory: Negative.  Negative for shortness of breath.   Cardiovascular: Negative.  Negative for palpitations.  Gastrointestinal: Negative.  Negative for nausea and vomiting.  Endocrine: Negative.   Genitourinary: Positive for urgency, frequency and flank pain. Negative for hesitancy and hematuria.  Musculoskeletal: Positive for back pain.  Neurological: Negative.  Negative for headaches.  Hematological: Negative.   Psychiatric/Behavioral: Negative.   All other systems reviewed and are negative.      Objective:   Physical Exam  Constitutional: She is oriented to person, place, and time. She appears well-developed and well-nourished. No distress.  HENT:  Head: Normocephalic and atraumatic.  Eyes: Pupils are equal, round, and reactive to light.  Neck: Normal range of motion. Neck supple. No thyromegaly present.  Cardiovascular: Normal rate, regular rhythm, normal heart sounds and intact distal pulses.   No murmur heard. Pulmonary/Chest: Effort normal and breath sounds normal. No respiratory distress. She has no wheezes.    Abdominal: Soft. Bowel sounds are normal. She exhibits no distension. There is no tenderness.  Musculoskeletal: Normal range of motion. She exhibits no edema or tenderness.  Neurological: She is alert and oriented to person, place, and time. She has normal reflexes. No cranial nerve deficit.  Skin: Skin is warm and dry.  Psychiatric: She has a normal mood and affect. Her behavior is normal. Judgment and thought content normal.  Vitals reviewed.     BP 97/65 mmHg  Pulse 68  Temp(Src) 97.1 F (36.2 C) (Oral)  Ht 5\' 7"  (1.702 m)  Wt 176 lb (79.833 kg)  BMI 27.56 kg/m2     Assessment & Plan:  1. Low back pain without sciatica, unspecified back pain laterality - POCT UA - Microscopic Only - POCT urinalysis dipstick  2. Urinary tract infection without hematuria, site unspecified -Force fluids AZO over the counter X2 days RTO prn - sulfamethoxazole-trimethoprim (BACTRIM DS,SEPTRA DS) 800-160 MG per tablet; Take 1 tablet by mouth 2 (two) times daily.  Dispense: 10 tablet; Refill: 0  Jannifer Rodneyhristy Zaida Reiland, FNP

## 2014-08-27 ENCOUNTER — Other Ambulatory Visit: Payer: Self-pay | Admitting: Family Medicine

## 2014-08-28 NOTE — Telephone Encounter (Signed)
[facility]/14 [facility]

## 2014-08-29 ENCOUNTER — Other Ambulatory Visit: Payer: Self-pay | Admitting: Family Medicine

## 2014-08-30 NOTE — Telephone Encounter (Signed)
Last seen 08/15/14 [name] 

## 2014-08-31 ENCOUNTER — Telehealth: Payer: Self-pay | Admitting: Family

## 2014-09-01 MED ORDER — NITROFURANTOIN MONOHYD MACRO 100 MG PO CAPS: 100.0000 mg | ORAL_CAPSULE | Freq: Every day | ORAL | Status: DC

## 2014-09-01 NOTE — Telephone Encounter (Signed)
History reviewed. Patient does take Macrobid daily for UTI prevention. Medication sent to pharmacy.

## 2014-10-04 ENCOUNTER — Telehealth: Payer: Self-pay | Admitting: Nurse Practitioner

## 2014-10-05 NOTE — Telephone Encounter (Signed)
Please check on this.

## 2014-12-02 ENCOUNTER — Other Ambulatory Visit: Payer: Self-pay | Admitting: Family Medicine

## 2014-12-05 ENCOUNTER — Other Ambulatory Visit: Payer: Self-pay | Admitting: Family Medicine

## 2014-12-14 ENCOUNTER — Ambulatory Visit (INDEPENDENT_AMBULATORY_CARE_PROVIDER_SITE_OTHER): Payer: BLUE CROSS/BLUE SHIELD | Admitting: Family Medicine

## 2014-12-14 ENCOUNTER — Encounter: Payer: Self-pay | Admitting: Family Medicine

## 2014-12-14 VITALS — BP 100/61 | HR 55 | Temp 97.4°F | Ht 67.0 in | Wt 182.0 lb

## 2014-12-14 DIAGNOSIS — N3000 Acute cystitis without hematuria: Secondary | ICD-10-CM

## 2014-12-14 DIAGNOSIS — R300 Dysuria: Secondary | ICD-10-CM | POA: Diagnosis not present

## 2014-12-14 LAB — POCT UA - MICROSCOPIC ONLY
Casts, Ur, LPF, POC: NEGATIVE
Crystals, Ur, HPF, POC: NEGATIVE
Mucus, UA: NEGATIVE
RBC, urine, microscopic: NEGATIVE
Yeast, UA: NEGATIVE

## 2014-12-14 LAB — POCT URINALYSIS DIPSTICK
Bilirubin, UA: NEGATIVE
Blood, UA: NEGATIVE
Glucose, UA: NEGATIVE
Ketones, UA: NEGATIVE
Leukocytes, UA: NEGATIVE
Nitrite, UA: NEGATIVE
Protein, UA: NEGATIVE
Spec Grav, UA: 1.01
Urobilinogen, UA: NEGATIVE
pH, UA: 5

## 2014-12-14 NOTE — Progress Notes (Signed)
BP 100/61 mmHg  Pulse 55  Temp(Src) 97.4 F (36.3 C) (Oral)  Ht  (1.702 m)  Wt 182 lb (82.555 kg)  BMI 28.50 kg/m2   Subjective:    Patient ID: Courtney Allen, female    DOB: [birth year], 49 y.o.   MRN: [id number]  HPI: Courtney Allen is a 49 y.o. female presenting on 12/14/2014 for Urinary Tract Infection   HPI UTI Patient has recurrent UTIs because of her hygiene and mental illness. She has been on prophylactic Macrobid hence he'll presents with UTI symptoms of dysuria and flank pain. This episode has been going on for 3 weeks. Denies fevers or chills, denies vomiting or nausea. Bactrim has worked for her on her last urinary tract infection.  Relevant past medical, surgical, family and social history reviewed and updated as indicated. Interim medical history since our last visit reviewed. Allergies and medications reviewed and updated.  Review of Systems  Constitutional: Negative for fever and chills.  HENT: Negative for congestion, ear discharge and ear pain.   Eyes: Negative for redness and visual disturbance.  Respiratory: Negative for chest tightness and shortness of breath.   Cardiovascular: Negative for chest pain and leg swelling.  Genitourinary: Positive for dysuria, urgency (as well as incontinence), frequency and flank pain. Negative for difficulty urinating.  Musculoskeletal: Negative for back pain and gait problem.  Skin: Negative for rash.  Neurological: Negative for light-headedness and headaches.  Psychiatric/Behavioral: Negative for behavioral problems and agitation.  All other systems reviewed and are negative.   Per HPI unless specifically indicated above     Medication List       This list is accurate as of: 12/14/14 11:23 AM.  Always use your most recent med list.               buPROPion 150 MG 24 hr tablet  Commonly known as:  WELLBUTRIN XL  Take 1 tablet (150 mg total) by mouth daily.     carbamazepine 200 MG tablet  Commonly  known as:  TEGRETOL  Take 1 tablet (200 mg total) by mouth 2 (two) times daily.     cholecalciferol 1000 UNITS tablet  Commonly known as:  VITAMIN D  Take 1,000 Units by mouth daily.     hydrOXYzine 10 MG tablet  Commonly known as:  ATARAX/VISTARIL  Take 10 mg by mouth at bedtime as needed for anxiety.     mirtazapine 15 MG tablet  Commonly known as:  REMERON  Take 2 tablets (30 mg total) by mouth at bedtime. Take one at bedtime po.     nitrofurantoin (macrocrystal-monohydrate) 100 MG capsule  Commonly known as:  MACROBID  TAKE ONE CAPSULE BY MOUTH ONCE DAILY AT BEDTIME     QUEtiapine 25 MG tablet  Commonly known as:  SEROQUEL  Take 1 tablet (25 mg total) by mouth at bedtime.     sulfamethoxazole-trimethoprim 800-160 MG per tablet  Commonly known as:  BACTRIM DS,SEPTRA DS  Take 1 tablet by mouth 2 (two) times daily.           Objective:    BP 100/61 mmHg  Pulse 55  Temp(Src) 97.4 F (36.3 C) (Oral)  Ht  (1.702 m)  Wt 182 lb (82.555 kg)  BMI 28.50 kg/m2  Wt Readings from Last 3 Encounters:  12/14/14 182 lb (82.555 kg)  08/15/14 176 lb (79.833 kg)  07/24/14 179 lb (81.194 kg)    Physical Exam  Constitutional: She is oriented to person, place, and  time. She appears well-developed and well-nourished. No distress.  Eyes: Conjunctivae and EOM are normal.  Cardiovascular: Normal rate and regular rhythm.   No murmur heard. Pulmonary/Chest: Effort normal and breath sounds normal. No respiratory distress. She has no wheezes.  Abdominal: Soft. Normal appearance and bowel sounds are normal. There is tenderness in the suprapubic area. There is CVA tenderness (per patient's reaction, noticed difficult to assess because of the patient's mental status). There is no rigidity, no rebound and no guarding.  Musculoskeletal: She exhibits no edema or tenderness.  Neurological: She is alert and oriented to person, place, and time. Coordination normal.  Skin: Skin is warm and dry.  No rash noted. She is not diaphoretic.  Vitals reviewed.   Results for orders placed or performed in visit on 12/14/14  POCT urinalysis dipstick  Result Value Ref Range   Color, UA gold    Clarity, UA clear    Glucose, UA negative    Bilirubin, UA negative    Ketones, UA negative    Spec Grav, UA 1.010    Blood, UA negative    pH, UA 5.0    Protein, UA negative    Urobilinogen, UA negative    Nitrite, UA negative    Leukocytes, UA Negative Negative  POCT UA - Microscopic Only  Result Value Ref Range   WBC, Ur, HPF, POC occ    RBC, urine, microscopic negative    Bacteria, U Microscopic occ    Mucus, UA negative    Epithelial cells, urine per micros occ    Crystals, Ur, HPF, POC negative    Casts, Ur, LPF, POC negative    Yeast, UA negative       Assessment & Plan:   Problem List Items Addressed This Visit    None    Visit Diagnoses    Dysuria    -  Primary    Relevant Orders    POCT urinalysis dipstick (Completed)    POCT UA - Microscopic Only (Completed)    Acute cystitis        Patient has symptoms of acute cystitis, has been on prophylactic Macrobid. So u/a results don't show infection but will treat symptoms. hold Macrobid        Follow up plan: Return in about 1 week (around 12/21/2014), or if symptoms worsen or fail to improve, for Follow-up UTI.  Arville Care, MD [facility] 12/14/2014, 11:23 AM

## 2014-12-14 NOTE — Patient Instructions (Signed)

## 2014-12-18 ENCOUNTER — Telehealth: Payer: Self-pay | Admitting: Family Medicine

## 2014-12-18 DIAGNOSIS — N390 Urinary tract infection, site not specified: Secondary | ICD-10-CM

## 2014-12-18 MED ORDER — SULFAMETHOXAZOLE-TRIMETHOPRIM 800-160 MG PO TABS: 1.0000 | ORAL_TABLET | Freq: Two times a day (BID) | ORAL | Status: DC

## 2014-12-18 NOTE — Telephone Encounter (Signed)
Bactrim Prescription sent to pharmacy  °

## 2014-12-18 NOTE — Telephone Encounter (Signed)
Detailed message left for patients husband that rx has been sent to the pharmacy.

## 2014-12-21 ENCOUNTER — Ambulatory Visit: Payer: BLUE CROSS/BLUE SHIELD | Admitting: Family Medicine

## 2015-01-08 ENCOUNTER — Other Ambulatory Visit: Payer: Self-pay | Admitting: Nurse Practitioner

## 2015-01-08 ENCOUNTER — Other Ambulatory Visit: Payer: Self-pay | Admitting: Family Medicine

## 2015-01-08 NOTE — Telephone Encounter (Signed)
Last seen 12/14/14  DR Dettinger

## 2015-01-08 NOTE — Telephone Encounter (Signed)
For Dr. Louanne Skye

## 2015-01-08 NOTE — Telephone Encounter (Signed)
Pt needs to be seen

## 2015-01-09 ENCOUNTER — Other Ambulatory Visit: Payer: Self-pay | Admitting: Family Medicine

## 2015-01-09 ENCOUNTER — Telehealth: Payer: Self-pay | Admitting: Family Medicine

## 2015-01-09 MED ORDER — NITROFURANTOIN MONOHYD MACRO 100 MG PO CAPS: ORAL_CAPSULE | ORAL | Status: DC

## 2015-01-09 NOTE — Telephone Encounter (Signed)
Detailed message left that Rx was sent into pharmacy for her macrobid and that she can keep or cancel appointment in the am.

## 2015-01-09 NOTE — Telephone Encounter (Signed)
Appointment scheduled for tomorrow with Dettinger.

## 2015-01-09 NOTE — Telephone Encounter (Signed)
Go ahead and send refill for Macrobid, send for five-month refill.

## 2015-01-10 ENCOUNTER — Ambulatory Visit (INDEPENDENT_AMBULATORY_CARE_PROVIDER_SITE_OTHER): Payer: BLUE CROSS/BLUE SHIELD | Admitting: Family Medicine

## 2015-01-10 ENCOUNTER — Encounter: Payer: Self-pay | Admitting: Family Medicine

## 2015-01-10 VITALS — BP 100/62 | HR 57 | Temp 97.1°F | Ht 67.0 in | Wt 184.4 lb

## 2015-01-10 DIAGNOSIS — R350 Frequency of micturition: Secondary | ICD-10-CM

## 2015-01-10 NOTE — Progress Notes (Signed)
BP 100/62 mmHg  Pulse 57  Temp(Src) 97.1 F (36.2 C) (Oral)  Ht  (1.702 m)  Wt 184 lb 6.4 oz (83.643 kg)  BMI 28.87 kg/m2   Subjective:    Patient ID: Courtney Allen, female    DOB: [birth year], 49 y.o.   MRN: [id number]  HPI: Courtney Allen is a 49 y.o. female presenting on 01/10/2015 for Follow up UTI   HPI Dysuria Patient has recurrent dysuria and has been treated for recurrent UTIs and is currently on prophylactic Macrobid. She presents today with the same symptoms again and we ran a UTI on her and we'll run a culture. She continues to take her Macrobid prophylactically but does admit to poor hygiene as the reason for why she is having issues. Husband is with her today and he does not want to do home health care because it cost too much money even with insurance aid. She denies fevers or chills and denies flank pain as well.  Relevant past medical, surgical, family and social history reviewed and updated as indicated. Interim medical history since our last visit reviewed. Allergies and medications reviewed and updated.  Review of Systems  Constitutional: Negative for fever and chills.  HENT: Negative for congestion, ear discharge and ear pain.   Eyes: Negative for redness and visual disturbance.  Respiratory: Negative for chest tightness and shortness of breath.   Cardiovascular: Negative for chest pain and leg swelling.  Genitourinary: Positive for dysuria, urgency (as well as incontinence) and frequency. Negative for flank pain and difficulty urinating.  Musculoskeletal: Negative for back pain and gait problem.  Skin: Negative for rash.  Neurological: Negative for light-headedness and headaches.  Psychiatric/Behavioral: Negative for behavioral problems and agitation.  All other systems reviewed and are negative.   Per HPI unless specifically indicated above     Medication List       This list is accurate as of: 01/10/15 11:02 AM.  Always use your most recent  med list.               buPROPion 150 MG 24 hr tablet  Commonly known as:  WELLBUTRIN XL  Take 1 tablet (150 mg total) by mouth daily.     carbamazepine 200 MG tablet  Commonly known as:  TEGRETOL  Take 1 tablet (200 mg total) by mouth 2 (two) times daily.     cholecalciferol 1000 UNITS tablet  Commonly known as:  VITAMIN D  Take 1,000 Units by mouth daily.     hydrOXYzine 10 MG tablet  Commonly known as:  ATARAX/VISTARIL  Take 10 mg by mouth at bedtime as needed for anxiety.     mirtazapine 15 MG tablet  Commonly known as:  REMERON  Take 2 tablets (30 mg total) by mouth at bedtime. Take one at bedtime po.     nitrofurantoin (macrocrystal-monohydrate) 100 MG capsule  Commonly known as:  MACROBID  TAKE ONE CAPSULE BY MOUTH ONCE DAILY AT BEDTIME     QUEtiapine 25 MG tablet  Commonly known as:  SEROQUEL  Take 1 tablet (25 mg total) by mouth at bedtime.           Objective:    BP 100/62 mmHg  Pulse 57  Temp(Src) 97.1 F (36.2 C) (Oral)  Ht  (1.702 m)  Wt 184 lb 6.4 oz (83.643 kg)  BMI 28.87 kg/m2  Wt Readings from Last 3 Encounters:  01/10/15 184 lb 6.4 oz (83.643 kg)  12/14/14 182 lb (82.555 kg)  08/15/14 176 lb (79.833 kg)    Physical Exam  Constitutional: She is oriented to person, place, and time. She appears well-developed and well-nourished. No distress.  Eyes: Conjunctivae and EOM are normal. Pupils are equal, round, and reactive to light.  Cardiovascular: Normal rate and regular rhythm.   No murmur heard. Pulmonary/Chest: Effort normal and breath sounds normal. No respiratory distress. She has no wheezes.  Abdominal: Soft. Bowel sounds are normal. She exhibits no distension. There is no tenderness. There is no rebound and no guarding.  No tenderness on exam today.  Musculoskeletal: Normal range of motion. She exhibits no edema or tenderness.  Neurological: She is alert and oriented to person, place, and time. Coordination normal.  Skin: Skin is  warm and dry. No rash noted. She is not diaphoretic.  Psychiatric: She has a normal mood and affect. Her behavior is normal.  Vitals reviewed.   Results for orders placed or performed in visit on 12/14/14  POCT urinalysis dipstick  Result Value Ref Range   Color, UA gold    Clarity, UA clear    Glucose, UA negative    Bilirubin, UA negative    Ketones, UA negative    Spec Grav, UA 1.010    Blood, UA negative    pH, UA 5.0    Protein, UA negative    Urobilinogen, UA negative    Nitrite, UA negative    Leukocytes, UA Negative Negative  POCT UA - Microscopic Only  Result Value Ref Range   WBC, Ur, HPF, POC occ    RBC, urine, microscopic negative    Bacteria, U Microscopic occ    Mucus, UA negative    Epithelial cells, urine per micros occ    Crystals, Ur, HPF, POC negative    Casts, Ur, LPF, POC negative    Yeast, UA negative       Assessment & Plan:   Problem List Items Addressed This Visit    None    Visit Diagnoses    Urinary frequency    -  Primary    Patient has recurrent UTIs/poor health hygiene because of mental status. We'll check a culture and await results before sending antibiotic.    Relevant Orders    POCT UA - Microscopic Only    POCT urinalysis dipstick    Urine culture        Follow up plan: Return if symptoms worsen or fail to improve.  Arville Care, MD [facility] 01/10/2015, 11:02 AM

## 2015-01-16 ENCOUNTER — Telehealth: Payer: Self-pay | Admitting: Family Medicine

## 2015-01-17 NOTE — Telephone Encounter (Signed)
LMTCB. Orders are in for urine that Dr. Darrol Poke nurse knows was collected but there are no results in our lab or in the system.

## 2015-01-18 NOTE — Telephone Encounter (Signed)
Urine was ordered but no resulted. Patient needs to return for urine specimen or bring in a sterile specimen. Husband says he has urine collection containers at work and that he will collect a specimen and bring it over.

## 2015-02-04 ENCOUNTER — Other Ambulatory Visit: Payer: BLUE CROSS/BLUE SHIELD

## 2015-02-06 LAB — URINE CULTURE: Organism ID, Bacteria: NO GROWTH

## 2015-02-12 ENCOUNTER — Telehealth: Payer: Self-pay | Admitting: Nurse Practitioner

## 2015-02-12 NOTE — Telephone Encounter (Signed)
Patient aware of results.

## 2015-03-14 ENCOUNTER — Ambulatory Visit (INDEPENDENT_AMBULATORY_CARE_PROVIDER_SITE_OTHER): Payer: BLUE CROSS/BLUE SHIELD

## 2015-03-14 DIAGNOSIS — Z23 Encounter for immunization: Secondary | ICD-10-CM | POA: Diagnosis not present

## 2015-04-24 ENCOUNTER — Ambulatory Visit (INDEPENDENT_AMBULATORY_CARE_PROVIDER_SITE_OTHER): Payer: BLUE CROSS/BLUE SHIELD | Admitting: *Deleted

## 2015-04-24 DIAGNOSIS — Z23 Encounter for immunization: Secondary | ICD-10-CM | POA: Diagnosis not present

## 2015-07-05 ENCOUNTER — Ambulatory Visit (INDEPENDENT_AMBULATORY_CARE_PROVIDER_SITE_OTHER): Payer: BLUE CROSS/BLUE SHIELD | Admitting: Family Medicine

## 2015-07-05 ENCOUNTER — Encounter: Payer: Self-pay | Admitting: Family Medicine

## 2015-07-05 VITALS — BP 102/66 | HR 60 | Temp 97.2°F | Ht 67.0 in | Wt 191.4 lb

## 2015-07-05 DIAGNOSIS — L749 Eccrine sweat disorder, unspecified: Secondary | ICD-10-CM

## 2015-07-05 DIAGNOSIS — N3000 Acute cystitis without hematuria: Secondary | ICD-10-CM

## 2015-07-05 DIAGNOSIS — L750 Bromhidrosis: Secondary | ICD-10-CM

## 2015-07-05 LAB — POCT URINALYSIS DIPSTICK
Bilirubin, UA: NEGATIVE
Glucose, UA: NEGATIVE
Ketones, UA: NEGATIVE
Nitrite, UA: NEGATIVE
Spec Grav, UA: >=1.03
Urobilinogen, UA: NEGATIVE
pH, UA: 6

## 2015-07-05 LAB — POCT UA - MICROSCOPIC ONLY
Casts, Ur, LPF, POC: NEGATIVE
Crystals, Ur, HPF, POC: NEGATIVE
Mucus, UA: NEGATIVE
Yeast, UA: NEGATIVE

## 2015-07-05 MED ORDER — SULFAMETHOXAZOLE-TRIMETHOPRIM 800-160 MG PO TABS: 1.0000 | ORAL_TABLET | Freq: Two times a day (BID) | ORAL | Status: DC

## 2015-07-05 NOTE — Progress Notes (Signed)
BP 102/66 mmHg  Pulse 60  Temp(Src) 97.2 F (36.2 C) (Oral)  Ht 5\' 7"  (1.702 m)  Wt 191 lb 6.4 oz (86.818 kg)  BMI 29.97 kg/m2   Subjective:    Patient ID: Courtney Allen, female    DOB: 1965/07/09, 50 y.o.   MRN: [id number]  HPI: Courtney Allen is a 50 y.o. female presenting on 07/05/2015 for Back pain, urinary odor   HPI Dysuria and back pain and odor Patient has been having dysuria and back pain more on the left than the right and increase oral). She has no incontinence and gets UTIs regularly. She left a sample see if she has a bladder infection. She denies any hematuria. She denies any fevers or chills. She is here today with her husband because she has severe dementia.  Relevant past medical, surgical, family and social history reviewed and updated as indicated. Interim medical history since our last visit reviewed. Allergies and medications reviewed and updated.  Review of Systems  Constitutional: Negative for fever and chills.  HENT: Negative for congestion, ear discharge and ear pain.   Eyes: Negative for redness and visual disturbance.  Respiratory: Negative for chest tightness and shortness of breath.   Cardiovascular: Negative for chest pain and leg swelling.  Genitourinary: Positive for dysuria, urgency (as well as incontinence) and frequency. Negative for flank pain and difficulty urinating.  Musculoskeletal: Negative for back pain and gait problem.  Skin: Negative for rash.  Neurological: Negative for light-headedness and headaches.  Psychiatric/Behavioral: Negative for behavioral problems and agitation.  All other systems reviewed and are negative.   Per HPI unless specifically indicated above     Medication List       This list is accurate as of: 07/05/15 11:30 AM.  Always use your most recent med list.               buPROPion 150 MG 24 hr tablet  Commonly known as:  WELLBUTRIN XL  Take 1 tablet (150 mg total) by mouth daily.     carbamazepine 200 MG tablet  Commonly known as:  TEGRETOL  Take 1 tablet (200 mg total) by mouth 2 (two) times daily.     cholecalciferol 1000 units tablet  Commonly known as:  VITAMIN D  Take 1,000 Units by mouth daily.     hydrOXYzine 10 MG tablet  Commonly known as:  ATARAX/VISTARIL  Take 10 mg by mouth at bedtime as needed for anxiety.     mirtazapine 15 MG tablet  Commonly known as:  REMERON  Take 2 tablets (30 mg total) by mouth at bedtime. Take one at bedtime po.     QUEtiapine 25 MG tablet  Commonly known as:  SEROQUEL  Take 1 tablet (25 mg total) by mouth at bedtime.     sulfamethoxazole-trimethoprim 800-160 MG tablet  Commonly known as:  BACTRIM DS,SEPTRA DS  Take 1 tablet by mouth 2 (two) times daily.           Objective:    BP 102/66 mmHg  Pulse 60  Temp(Src) 97.2 F (36.2 C) (Oral)  Ht 5\' 7"  (1.702 m)  Wt 191 lb 6.4 oz (86.818 kg)  BMI 29.97 kg/m2  Wt Readings from Last 3 Encounters:  07/05/15 191 lb 6.4 oz (86.818 kg)  01/10/15 184 lb 6.4 oz (83.643 kg)  12/14/14 182 lb (82.555 kg)    Physical Exam  Constitutional: She is oriented to person, place, and time. She appears well-developed and well-nourished. No distress.  Eyes: Conjunctivae and EOM are normal. Pupils are equal, round, and reactive to light.  Cardiovascular: Normal rate and regular rhythm.   No murmur heard. Pulmonary/Chest: Effort normal and breath sounds normal. No respiratory distress. She has no wheezes.  Abdominal: Soft. Bowel sounds are normal. She exhibits no distension. There is no tenderness (No tenderness on exam, no CVA tenderness either.). There is no rebound and no guarding.  No tenderness on exam today.  Musculoskeletal: Normal range of motion. She exhibits no edema or tenderness.  Neurological: She is alert and oriented to person, place, and time. Coordination normal.  Skin: Skin is warm and dry. No rash noted. She is not diaphoretic.  Psychiatric: She has a normal mood and  affect. Her behavior is normal.  Vitals reviewed.   Results for orders placed or performed in visit on 07/05/15  POCT UA - Microscopic Only  Result Value Ref Range   WBC, Ur, HPF, POC tntc    RBC, urine, microscopic 10-12    Bacteria, U Microscopic many    Mucus, UA negative    Epithelial cells, urine per micros moderate    Crystals, Ur, HPF, POC negative    Casts, Ur, LPF, POC negative    Yeast, UA negative   POCT urinalysis dipstick  Result Value Ref Range   Color, UA gold    Clarity, UA cloudy    Glucose, UA negative    Bilirubin, UA negative    Ketones, UA negative    Spec Grav, UA >=1.030    Blood, UA large    pH, UA 6.0    Protein, UA 4+    Urobilinogen, UA negative    Nitrite, UA negative    Leukocytes, UA large (3+) (A) Negative      Assessment & Plan:   Problem List Items Addressed This Visit    None    Visit Diagnoses    Urinary body odor    -  Primary    Relevant Medications    sulfamethoxazole-trimethoprim (BACTRIM DS,SEPTRA DS) 800-160 MG tablet    Other Relevant Orders    POCT UA - Microscopic Only (Completed)    POCT urinalysis dipstick (Completed)    Urine culture    Acute cystitis without hematuria        Relevant Medications    sulfamethoxazole-trimethoprim (BACTRIM DS,SEPTRA DS) 800-160 MG tablet    Other Relevant Orders    POCT UA - Microscopic Only (Completed)    POCT urinalysis dipstick (Completed)    Urine culture        Follow up plan: Return if symptoms worsen or fail to improve.  Counseling provided for all of the vaccine components Orders Placed This Encounter  Procedures  . Urine culture  . POCT UA - Microscopic Only  . POCT urinalysis dipstick    Arville Care, MD [facility] 07/05/2015, 11:30 AM

## 2015-07-11 ENCOUNTER — Other Ambulatory Visit: Payer: Self-pay | Admitting: Family Medicine

## 2015-07-19 LAB — HM MAMMOGRAPHY

## 2015-07-29 ENCOUNTER — Ambulatory Visit (INDEPENDENT_AMBULATORY_CARE_PROVIDER_SITE_OTHER): Payer: BLUE CROSS/BLUE SHIELD | Admitting: Family

## 2015-07-29 ENCOUNTER — Encounter: Payer: Self-pay | Admitting: Family

## 2015-07-29 VITALS — BP 99/60 | HR 73 | Temp 97.1°F | Ht 67.0 in | Wt 191.4 lb

## 2015-07-29 DIAGNOSIS — Z1159 Encounter for screening for other viral diseases: Secondary | ICD-10-CM

## 2015-07-29 DIAGNOSIS — Z01419 Encounter for gynecological examination (general) (routine) without abnormal findings: Secondary | ICD-10-CM

## 2015-07-29 DIAGNOSIS — R3981 Functional urinary incontinence: Secondary | ICD-10-CM

## 2015-07-29 DIAGNOSIS — Z0000 Encounter for general adult medical examination without abnormal findings: Secondary | ICD-10-CM

## 2015-07-29 DIAGNOSIS — F71 Moderate intellectual disabilities: Secondary | ICD-10-CM

## 2015-07-29 DIAGNOSIS — Z1211 Encounter for screening for malignant neoplasm of colon: Secondary | ICD-10-CM

## 2015-07-29 DIAGNOSIS — F0281 Dementia in other diseases classified elsewhere with behavioral disturbance: Secondary | ICD-10-CM

## 2015-07-29 DIAGNOSIS — F02818 Dementia in other diseases classified elsewhere, unspecified severity, with other behavioral disturbance: Secondary | ICD-10-CM

## 2015-07-29 NOTE — Patient Instructions (Signed)
Health Maintenance, Female Adopting a healthy lifestyle and getting preventive care can go a long way to promote health and wellness. Talk with your health care provider about what schedule of regular examinations is right for you. This is a good chance for you to check in with your provider about disease prevention and staying healthy. In between checkups, there are plenty of things you can do on your own. Experts have done a lot of research about which lifestyle changes and preventive measures are most likely to keep you healthy. Ask your health care provider for more information. WEIGHT AND DIET  Eat a healthy diet  Be sure to include plenty of vegetables, fruits, low-fat dairy products, and lean protein.  Do not eat a lot of foods high in solid fats, added sugars, or salt.  Get regular exercise. This is one of the most important things you can do for your health.  Most adults should exercise for at least 150 minutes each week. The exercise should increase your heart rate and make you sweat (moderate-intensity exercise).  Most adults should also do strengthening exercises at least twice a week. This is in addition to the moderate-intensity exercise.  Maintain a healthy weight  Body mass index (BMI) is a measurement that can be used to identify possible weight problems. It estimates body fat based on height and weight. Your health care provider can help determine your BMI and help you achieve or maintain a healthy weight.  For females 40 years of age and older:   A BMI below 18.5 is considered underweight.  A BMI of 18.5 to 24.9 is normal.  A BMI of 25 to 29.9 is considered overweight.  A BMI of 30 and above is considered obese.  Watch levels of cholesterol and blood lipids  You should start having your blood tested for lipids and cholesterol at 51 years of age, then have this test every 5 years.  You may need to have your cholesterol levels checked more often if:  Your lipid  or cholesterol levels are high.  You are older than 51 years of age.  You are at high risk for heart disease.  CANCER SCREENING   Lung Cancer  Lung cancer screening is recommended for adults 42-21 years old who are at high risk for lung cancer because of a history of smoking.  A yearly low-dose CT scan of the lungs is recommended for people who:  Currently smoke.  Have quit within the past 15 years.  Have at least a 30-pack-year history of smoking. A pack year is smoking an average of one pack of cigarettes a day for 1 year.  Yearly screening should continue until it has been 15 years since you quit.  Yearly screening should stop if you develop a health problem that would prevent you from having lung cancer treatment.  Breast Cancer  Practice breast self-awareness. This means understanding how your breasts normally appear and feel.  It also means doing regular breast self-exams. Let your health care provider know about any changes, no matter how small.  If you are in your 20s or 30s, you should have a clinical breast exam (CBE) by a health care provider every 1-3 years as part of a regular health exam.  If you are 90 or older, have a CBE every year. Also consider having a breast X-ray (mammogram) every year.  If you have a family history of breast cancer, talk to your health care provider about genetic screening.  If you  are at high risk for breast cancer, talk to your health care provider about having an MRI and a mammogram every year.  Breast cancer gene (BRCA) assessment is recommended for women who have family members with BRCA-related cancers. BRCA-related cancers include:  Breast.  Ovarian.  Tubal.  Peritoneal cancers.  Results of the assessment will determine the need for genetic counseling and BRCA1 and BRCA2 testing. Cervical Cancer Your health care provider may recommend that you be screened regularly for cancer of the pelvic organs (ovaries, uterus, and  vagina). This screening involves a pelvic examination, including checking for microscopic changes to the surface of your cervix (Pap test). You may be encouraged to have this screening done every 3 years, beginning at age 21.  For women ages 30-65, health care providers may recommend pelvic exams and Pap testing every 3 years, or they may recommend the Pap and pelvic exam, combined with testing for human papilloma virus (HPV), every 5 years. Some types of HPV increase your risk of cervical cancer. Testing for HPV may also be done on women of any age with unclear Pap test results.  Other health care providers may not recommend any screening for nonpregnant women who are considered low risk for pelvic cancer and who do not have symptoms. Ask your health care provider if a screening pelvic exam is right for you.  If you have had past treatment for cervical cancer or a condition that could lead to cancer, you need Pap tests and screening for cancer for at least 20 years after your treatment. If Pap tests have been discontinued, your risk factors (such as having a new sexual partner) need to be reassessed to determine if screening should resume. Some women have medical problems that increase the chance of getting cervical cancer. In these cases, your health care provider may recommend more frequent screening and Pap tests. Colorectal Cancer  This type of cancer can be detected and often prevented.  Routine colorectal cancer screening usually begins at 50 years of age and continues through 51 years of age.  Your health care provider may recommend screening at an earlier age if you have risk factors for colon cancer.  Your health care provider may also recommend using home test kits to check for hidden blood in the stool.  A small camera at the end of a tube can be used to examine your colon directly (sigmoidoscopy or colonoscopy). This is done to check for the earliest forms of colorectal  cancer.  Routine screening usually begins at age 50.  Direct examination of the colon should be repeated every 5-10 years through 51 years of age. However, you may need to be screened more often if early forms of precancerous polyps or small growths are found. Skin Cancer  Check your skin from head to toe regularly.  Tell your health care provider about any new moles or changes in moles, especially if there is a change in a mole's shape or color.  Also tell your health care provider if you have a mole that is larger than the size of a pencil eraser.  Always use sunscreen. Apply sunscreen liberally and repeatedly throughout the day.  Protect yourself by wearing long sleeves, pants, a wide-brimmed hat, and sunglasses whenever you are outside. HEART DISEASE, DIABETES, AND HIGH BLOOD PRESSURE   High blood pressure causes heart disease and increases the risk of stroke. High blood pressure is more likely to develop in:  People who have blood pressure in the high end   of the normal range (130-139/85-89 mm Hg).  People who are overweight or obese.  People who are African American.  If you are 38-23 years of age, have your blood pressure checked every 3-5 years. If you are 61 years of age or older, have your blood pressure checked every year. You should have your blood pressure measured twice--once when you are at a hospital or clinic, and once when you are not at a hospital or clinic. Record the average of the two measurements. To check your blood pressure when you are not at a hospital or clinic, you can use:  An automated blood pressure machine at a pharmacy.  A home blood pressure monitor.  If you are between 45 years and 39 years old, ask your health care provider if you should take aspirin to prevent strokes.  Have regular diabetes screenings. This involves taking a blood sample to check your fasting blood sugar level.  If you are at a normal weight and have a low risk for diabetes,  have this test once every three years after 51 years of age.  If you are overweight and have a high risk for diabetes, consider being tested at a younger age or more often. PREVENTING INFECTION  Hepatitis B  If you have a higher risk for hepatitis B, you should be screened for this virus. You are considered at high risk for hepatitis B if:  You were born in a country where hepatitis B is common. Ask your health care provider which countries are considered high risk.  Your parents were born in a high-risk country, and you have not been immunized against hepatitis B (hepatitis B vaccine).  You have HIV or AIDS.  You use needles to inject street drugs.  You live with someone who has hepatitis B.  You have had sex with someone who has hepatitis B.  You get hemodialysis treatment.  You take certain medicines for conditions, including cancer, organ transplantation, and autoimmune conditions. Hepatitis C  Blood testing is recommended for:  Everyone born from [birth year] through 1966.  Anyone with known risk factors for hepatitis C. Sexually transmitted infections (STIs)  You should be screened for sexually transmitted infections (STIs) including gonorrhea and chlamydia if:  You are sexually active and are younger than 51 years of age.  You are older than 51 years of age and your health care provider tells you that you are at risk for this type of infection.  Your sexual activity has changed since you were last screened and you are at an increased risk for chlamydia or gonorrhea. Ask your health care provider if you are at risk.  If you do not have HIV, but are at risk, it may be recommended that you take a prescription medicine daily to prevent HIV infection. This is called pre-exposure prophylaxis (PrEP). You are considered at risk if:  You are sexually active and do not regularly use condoms or know the HIV status of your partner(s).  You take drugs by injection.  You are sexually  active with a partner who has HIV. Talk with your health care provider about whether you are at high risk of being infected with HIV. If you choose to begin PrEP, you should first be tested for HIV. You should then be tested every 3 months for as long as you are taking PrEP.  PREGNANCY   If you are premenopausal and you may become pregnant, ask your health care provider about preconception counseling.  If you may  become pregnant, take 400 to 800 micrograms (mcg) of folic acid every day.  If you want to prevent pregnancy, talk to your health care provider about birth control (contraception). OSTEOPOROSIS AND MENOPAUSE   Osteoporosis is a disease in which the bones lose minerals and strength with aging. This can result in serious bone fractures. Your risk for osteoporosis can be identified using a bone density scan.  If you are 61 years of age or older, or if you are at risk for osteoporosis and fractures, ask your health care provider if you should be screened.  Ask your health care provider whether you should take a calcium or vitamin D supplement to lower your risk for osteoporosis.  Menopause may have certain physical symptoms and risks.  Hormone replacement therapy may reduce some of these symptoms and risks. Talk to your health care provider about whether hormone replacement therapy is right for you.  HOME CARE INSTRUCTIONS   Schedule regular health, dental, and eye exams.  Stay current with your immunizations.   Do not use any tobacco products including cigarettes, chewing tobacco, or electronic cigarettes.  If you are pregnant, do not drink alcohol.  If you are breastfeeding, limit how much and how often you drink alcohol.  Limit alcohol intake to no more than 1 drink per day for nonpregnant women. One drink equals 12 ounces of beer, 5 ounces of wine, or 1 ounces of hard liquor.  Do not use street drugs.  Do not share needles.  Ask your health care provider for help if  you need support or information about quitting drugs.  Tell your health care provider if you often feel depressed.  Tell your health care provider if you have ever been abused or do not feel safe at home.   This information is not intended to replace advice given to you by your health care provider. Make sure you discuss any questions you have with your health care provider.   Document Released: 11/03/2010 Document Revised: 05/11/2014 Document Reviewed: 03/22/2013 Elsevier Interactive Patient Education [employer].

## 2015-07-29 NOTE — Progress Notes (Signed)
   Subjective:    Patient ID: Courtney Allen, female    DOB: 1966-03-12, 50 y.o.   MRN: [id number]    HPI PT presents to the office today for CPE with pap. Pt has dementia and goes to [facility] every 3 months who manages her dementia. Pt denies any headache, palpitations, SOB, or edema at this time.    Review of Systems  Constitutional: Negative.   HENT: Negative.   Eyes: Negative.   Respiratory: Negative.  Negative for shortness of breath.   Cardiovascular: Negative.  Negative for palpitations.  Gastrointestinal: Negative.   Endocrine: Negative.   Genitourinary: Negative.   Musculoskeletal: Negative.   Neurological: Negative.  Negative for headaches.  Hematological: Negative.   Psychiatric/Behavioral: Negative.   All other systems reviewed and are negative.      Objective:   Physical Exam  Constitutional: She is oriented to person, place, and time. She appears well-developed and well-nourished. No distress.  HENT:  Head: Normocephalic and atraumatic.  Right Ear: External ear normal.  Left Ear: External ear normal.  Nose: Nose normal.  Mouth/Throat: Oropharynx is clear and moist.  Eyes: Pupils are equal, round, and reactive to light.  Neck: Normal range of motion. Neck supple. No thyromegaly present.  Cardiovascular: Normal rate, regular rhythm, normal heart sounds and intact distal pulses.   No murmur heard. Pulmonary/Chest: Effort normal and breath sounds normal. No respiratory distress. She has no wheezes. Right breast exhibits no inverted nipple, no mass, no nipple discharge, no skin change and no tenderness. Left breast exhibits no inverted nipple, no mass, no nipple discharge, no skin change and no tenderness. Breasts are symmetrical.  Abdominal: Soft. Bowel sounds are normal. She exhibits no distension. There is no tenderness.  Genitourinary: Vagina normal.  Bimanual exam- no adnexal masses or tenderness, ovaries nonpalpable   Cervix parous and pink- No  discharge   Musculoskeletal: Normal range of motion. She exhibits no edema or tenderness.  Neurological: She is alert and oriented to person, place, and time. She has normal reflexes. No cranial nerve deficit.  Skin: Skin is warm and dry.  Psychiatric: She has a normal mood and affect. Her behavior is normal. Judgment and thought content normal.  Vitals reviewed.   BP 99/60 mmHg  Pulse 73  Temp(Src) 97.1 F (36.2 C) (Oral)  Ht '5\' 7"'$  (1.702 m)  Wt 191 lb 6.4 oz (86.818 kg)  BMI 29.97 kg/m2       Assessment & Plan:  1. Annual physical exam - Anemia Profile B - CMP14+EGFR - Lipid panel - Thyroid Panel With TSH - VITAMIN D 25 Hydroxy (Vit-D Deficiency, Fractures) - Pap IG w/ reflex to HPV when ASC-U - Hepatitis C Antibody  2. Encounter for routine gynecological examinatio - CMP14+EGFR - Pap IG w/ reflex to HPV when ASC-U  3. Need for hepatitis C screening test - CMP14+EGFR - Hepatitis C Antibody  4. Dementia due to medical condition with behavioral disturbance - CMP14+EGFR  5. Mental retardation, moderate (I.Q. 35-49) - CMP14+EGFR  6. Urinary incontinence due to cognitive impairment - CMP14+EGFR  7. Colon cancer screening - CMP14+EGFR - Fecal occult blood, imunochemical; Future   Continue all meds Labs pending Health Maintenance reviewed Diet and exercise encouraged RTO 6 months  Evelina Dun, FNP

## 2015-07-30 LAB — ANEMIA PROFILE B
Basophils Absolute: 0 10*3/uL (ref 0.0–0.2)
Basos: 1 %
EOS (ABSOLUTE): 0.1 10*3/uL (ref 0.0–0.4)
Eos: 2 %
Ferritin: 64 ng/mL (ref 15–150)
Folate: >20 ng/mL (ref >3.0)
Hematocrit: 38.1 % (ref 34.0–46.6)
Hemoglobin: 12.4 g/dL (ref 11.1–15.9)
Immature Grans (Abs): 0 10*3/uL (ref 0.0–0.1)
Immature Granulocytes: 0 %
Iron Saturation: 41 % (ref 15–55)
Iron: 95 ug/dL (ref 27–159)
Lymphocytes Absolute: 1.6 10*3/uL (ref 0.7–3.1)
Lymphs: 42 %
MCH: 30.5 pg (ref 26.6–33.0)
MCHC: 32.5 g/dL (ref 31.5–35.7)
MCV: 94 fL (ref 79–97)
Monocytes Absolute: 0.3 10*3/uL (ref 0.1–0.9)
Monocytes: 8 %
Neutrophils Absolute: 1.8 10*3/uL (ref 1.4–7.0)
Neutrophils: 47 %
Platelets: 174 10*3/uL (ref 150–379)
RBC: 4.07 x10E6/uL (ref 3.77–5.28)
RDW: 13.3 % (ref 12.3–15.4)
Retic Ct Pct: 1.2 % (ref 0.6–2.6)
Total Iron Binding Capacity: 231 ug/dL — ABNORMAL LOW (ref 250–450)
UIBC: 136 ug/dL (ref 131–425)
Vitamin B-12: 564 pg/mL (ref 211–946)
WBC: 3.8 10*3/uL (ref 3.4–10.8)

## 2015-07-30 LAB — CMP14+EGFR
ALT: 14 IU/L (ref 0–32)
AST: 15 IU/L (ref 0–40)
Albumin/Globulin Ratio: 1.9 (ref 1.2–2.2)
Albumin: 4.4 g/dL (ref 3.5–5.5)
Alkaline Phosphatase: 90 IU/L (ref 39–117)
BUN/Creatinine Ratio: 17 (ref 9–23)
BUN: 14 mg/dL (ref 6–24)
Bilirubin Total: <0.2 mg/dL (ref 0.0–1.2)
CO2: 24 mmol/L (ref 18–29)
Calcium: 9.1 mg/dL (ref 8.7–10.2)
Chloride: 103 mmol/L (ref 96–106)
Creatinine, Ser: 0.81 mg/dL (ref 0.57–1.00)
GFR calc Af Amer: 98 mL/min/{1.73_m2} (ref >59)
GFR calc non Af Amer: 85 mL/min/{1.73_m2} (ref >59)
Globulin, Total: 2.3 g/dL (ref 1.5–4.5)
Glucose: 76 mg/dL (ref 65–99)
Potassium: 4 mmol/L (ref 3.5–5.2)
Sodium: 140 mmol/L (ref 134–144)
Total Protein: 6.7 g/dL (ref 6.0–8.5)

## 2015-07-30 LAB — THYROID PANEL WITH TSH
Free Thyroxine Index: 1.8 (ref 1.2–4.9)
T3 Uptake Ratio: 29 % (ref 24–39)
T4, Total: 6.3 ug/dL (ref 4.5–12.0)
TSH: 1.67 u[IU]/mL (ref 0.450–4.500)

## 2015-07-30 LAB — LIPID PANEL
Chol/HDL Ratio: 3.7 ratio units (ref 0.0–4.4)
Cholesterol, Total: 194 mg/dL (ref 100–199)
HDL: 52 mg/dL (ref >39)
LDL Calculated: 124 mg/dL — ABNORMAL HIGH (ref 0–99)
Triglycerides: 90 mg/dL (ref 0–149)
VLDL Cholesterol Cal: 18 mg/dL (ref 5–40)

## 2015-07-30 LAB — VITAMIN D 25 HYDROXY (VIT D DEFICIENCY, FRACTURES): Vit D, 25-Hydroxy: 33.8 ng/mL (ref 30.0–100.0)

## 2015-07-30 LAB — HCV AB W REFLEX TO QUANT PCR: HCV Ab: <0.1 s/co ratio (ref 0.0–0.9)

## 2015-07-31 LAB — PAP IG W/ RFLX HPV ASCU: PAP Smear Comment: 0

## 2015-08-01 ENCOUNTER — Telehealth: Payer: Self-pay | Admitting: Nurse Practitioner

## 2015-08-13 NOTE — Telephone Encounter (Signed)
Labs printed and mailed.

## 2015-08-14 ENCOUNTER — Encounter: Payer: Self-pay | Admitting: *Deleted

## 2015-08-16 ENCOUNTER — Other Ambulatory Visit: Payer: Self-pay | Admitting: Family Medicine

## 2015-08-17 ENCOUNTER — Other Ambulatory Visit: Payer: Self-pay | Admitting: Nurse Practitioner

## 2015-08-19 NOTE — Telephone Encounter (Signed)
Patient was on macrobid 100 daily at bedtime but it looks like it is not on her medication list. Please advise

## 2015-08-19 NOTE — Telephone Encounter (Signed)
[facility] 07/29/15  [facility]

## 2015-08-19 NOTE — Telephone Encounter (Signed)
Patient ntbs for refill on macri=obid- has not been on regularly in awhile according to chart

## 2015-08-24 NOTE — Telephone Encounter (Signed)
Several attempts have been made to contact patient. This encounter will be closed.  

## 2015-10-18 DIAGNOSIS — M545 Low back pain: Secondary | ICD-10-CM | POA: Diagnosis not present

## 2015-10-18 DIAGNOSIS — R300 Dysuria: Secondary | ICD-10-CM | POA: Diagnosis not present

## 2015-11-04 DIAGNOSIS — F411 Generalized anxiety disorder: Secondary | ICD-10-CM | POA: Diagnosis not present

## 2015-11-04 DIAGNOSIS — F79 Unspecified intellectual disabilities: Secondary | ICD-10-CM | POA: Diagnosis not present

## 2015-11-04 DIAGNOSIS — F4312 Post-traumatic stress disorder, chronic: Secondary | ICD-10-CM | POA: Diagnosis not present

## 2015-11-04 DIAGNOSIS — F0281 Dementia in other diseases classified elsewhere with behavioral disturbance: Secondary | ICD-10-CM | POA: Diagnosis not present

## 2015-12-23 ENCOUNTER — Telehealth: Payer: Self-pay | Admitting: Nurse Practitioner

## 2015-12-23 ENCOUNTER — Other Ambulatory Visit: Payer: BLUE CROSS/BLUE SHIELD

## 2015-12-23 DIAGNOSIS — R109 Unspecified abdominal pain: Secondary | ICD-10-CM

## 2015-12-23 NOTE — Telephone Encounter (Signed)
Spoke to MMM and she states it is okay to bring in a urine sample and husband is aware.

## 2015-12-24 ENCOUNTER — Telehealth: Payer: Self-pay | Admitting: Nurse Practitioner

## 2015-12-24 LAB — URINE CULTURE

## 2015-12-24 NOTE — Telephone Encounter (Signed)
Advised pt's spouse that we sent it for a culture and it will take at 48 hours to grow any bacteria. Pt voiced understanding.

## 2016-02-21 ENCOUNTER — Encounter (INDEPENDENT_AMBULATORY_CARE_PROVIDER_SITE_OTHER): Payer: Self-pay

## 2016-02-21 ENCOUNTER — Encounter: Payer: Self-pay | Admitting: Nurse Practitioner

## 2016-02-21 ENCOUNTER — Ambulatory Visit (INDEPENDENT_AMBULATORY_CARE_PROVIDER_SITE_OTHER): Payer: BLUE CROSS/BLUE SHIELD | Admitting: Nurse Practitioner

## 2016-02-21 VITALS — BP 108/72 | HR 78 | Temp 98.0°F | Ht 67.0 in | Wt 193.0 lb

## 2016-02-21 DIAGNOSIS — Z23 Encounter for immunization: Secondary | ICD-10-CM | POA: Diagnosis not present

## 2016-02-21 DIAGNOSIS — K5900 Constipation, unspecified: Secondary | ICD-10-CM | POA: Diagnosis not present

## 2016-02-21 DIAGNOSIS — K921 Melena: Secondary | ICD-10-CM | POA: Diagnosis not present

## 2016-02-21 NOTE — Progress Notes (Signed)
   Subjective:    Patient ID: Courtney Allen, female    DOB: 03/29/1966, 50 y.o.   MRN: [id number]  HPI  Patient is in today because her husband found dark tarry substance in her pull up. He saw it 2 days ago and has not sen since. He says that he has been having hard stools. Her husband says that she has hemorrhoids and that may have been where came from.    Review of Systems  Constitutional: Negative.   HENT: Negative.   Respiratory: Negative.   Cardiovascular: Negative.   Gastrointestinal: Negative.   Genitourinary: Negative.   Neurological: Negative.   Psychiatric/Behavioral: Negative.   All other systems reviewed and are negative.      Objective:   Physical Exam  Constitutional: She is oriented to person, place, and time. She appears well-developed and well-nourished. No distress.  Cardiovascular: Normal rate and normal heart sounds.   Pulmonary/Chest: Effort normal and breath sounds normal.  Abdominal: Soft.  Neurological: She is alert and oriented to person, place, and time.  Skin: Skin is warm.  Psychiatric: She has a normal mood and affect. Her behavior is normal. Judgment and thought content normal.    BP 108/72   Pulse 78   Temp 98 F (36.7 C) (Oral)   Ht 5\' 7"  (1.702 m)   Wt 193 lb (87.5 kg)   BMI 30.23 kg/m      Assessment & Plan:   1. Constipation, unspecified constipation type   2. Black tarry stools    Orders Placed This Encounter  Procedures  . Ambulatory referral to Gastroenterology    Referral Priority:   Routine    Referral Type:   Consultation    Referral Reason:   Specialty Services Required    Number of Visits Requested:   1   Continue to watch for further bleeding miralax daily RTO prn  Mary-Margaret Daphine DeutscherMartin, FNP

## 2016-03-02 ENCOUNTER — Encounter: Payer: Self-pay | Admitting: Gastroenterology

## 2016-03-17 ENCOUNTER — Ambulatory Visit: Payer: Self-pay | Admitting: Gastroenterology

## 2016-03-24 ENCOUNTER — Encounter: Payer: Self-pay | Admitting: Gastroenterology

## 2016-03-24 ENCOUNTER — Ambulatory Visit (INDEPENDENT_AMBULATORY_CARE_PROVIDER_SITE_OTHER): Payer: BLUE CROSS/BLUE SHIELD | Admitting: Gastroenterology

## 2016-03-24 VITALS — BP 99/65 | HR 52 | Temp 97.7°F | Ht 67.0 in | Wt 192.2 lb

## 2016-03-24 DIAGNOSIS — Z0000 Encounter for general adult medical examination without abnormal findings: Secondary | ICD-10-CM | POA: Insufficient documentation

## 2016-03-24 DIAGNOSIS — R195 Other fecal abnormalities: Secondary | ICD-10-CM | POA: Diagnosis not present

## 2016-03-24 DIAGNOSIS — Z1211 Encounter for screening for malignant neoplasm of colon: Secondary | ICD-10-CM | POA: Diagnosis not present

## 2016-03-24 NOTE — Patient Instructions (Signed)
1. Please have your labs done. 2. Cologuard testing for colon cancer. We will contact you with results as available.

## 2016-03-24 NOTE — Progress Notes (Signed)
Primary Care Physician:  Bennie PieriniMary-Margaret Martin, FNP  Primary Gastroenterologist:  Jonette EvaSandi Fields, MD   Chief Complaint  Patient presents with  . Blood In Stools    constipation, black stool, better since started stool softener/fiber. ?Cologard    HPI:  Courtney Allen is a 50 y.o. female here at the request of PCP for further evaluation of dark stools/black stools. Patient Presents with her husband today who provides all history. Patient has dementia.  Husband states that recently he saw dark stool in her depend. She has had issues with constipation, [place] stool 1. Doing better now on stool softener and fiber supplements. According to the husband, her stools are now normal. He states he has some degree of color blindness. He can see red and denies any red blood in stool. He states the stool looked black to him versus dark brown. He's really not sure. No recent hemoglobin. Hemoccult status unknown.  Patient has never had a colonoscopy. Spouse does not want to pursue colonoscopy because he feels that she would be extremely difficult to prep. He prefers colon cancer screening via Cologuard.  If positive, he is willing to pursue colonoscopy.  Overall patient seems to be doing well. Her appetite has improved with appetite stimulant. She has been gaining weight. There is been no vomiting. No distress with meals.  Patient's mother either had pancreatitic cancer/colon cancer. Deceased in her mid 4960s.     Current Outpatient Prescriptions  Medication Sig Dispense Refill  . buPROPion (WELLBUTRIN XL) 150 MG 24 hr tablet Take 1 tablet (150 mg total) by mouth daily. 90 tablet 3  . carbamazepine (TEGRETOL) 200 MG tablet Take 1 tablet (200 mg total) by mouth 2 (two) times daily. (Patient taking differently: Take 200 mg by mouth daily. ) 60 tablet 5  . cholecalciferol (VITAMIN D) 1000 UNITS tablet Take 1,000 Units by mouth daily.    . hydrOXYzine (ATARAX/VISTARIL) 10 MG tablet Take 10 mg by mouth at  bedtime as needed for anxiety.    . mirtazapine (REMERON) 15 MG tablet Take 2 tablets (30 mg total) by mouth at bedtime. Take one at bedtime po. 90 tablet 3  . QUEtiapine (SEROQUEL) 25 MG tablet Take 1 tablet (25 mg total) by mouth at bedtime. 30 tablet 5   No current facility-administered medications for this visit.     Allergies as of 03/24/2016  . (No Known Allergies)    Past Medical History:  Diagnosis Date  . Bipolar I disorder, most recent episode (or current) unspecified   . Dementia   . 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   . Depression   . Developmental delay   . Fetal alcohol syndrome 01/18/2013  . General learning disability 01/18/2013  . Memory loss   . Mental retardation, moderate (I.Q. 35-49) 01/18/2013  . Retinitis pigmentosa, both eyes 01/18/2013  . Urinary incontinence   . Urinary incontinence due to cognitive impairment 01/18/2013    Past Surgical History:  Procedure Laterality Date  . CESAREAN SECTION    . TONSILLECTOMY    . TUBAL LIGATION      Family History  Problem Relation Age of Onset  . Alcohol abuse Mother   . Heart attack Mother [id number]  . Diabetes Mother   . Cancer Mother     Deceased in mid 7360s, either colon cancer or pancreatic cancer reported per patient's spouse  . Emphysema Brother   . Diabetes Brother  Social History   Social History  . Marital status: Married    Spouse name: Jonny RuizJohn  . Number of children: 3  . Years of education: 1111   Occupational History  . disabled    Social History Main Topics  . Smoking status: Never Smoker  . Smokeless tobacco: Never Used  . Alcohol use No  . Drug use: No  . Sexual activity: Not on file   Other Topics Concern  . Not on file   Social History Narrative  . No narrative on file      ZOX:[id number]:Patient unable to provide dependable history   Physical  Examination:  BP 99/65   Pulse (!) 52   Temp 97.7 F (36.5 C) (Oral)   Ht 5\' 7"  (1.702 m)   Wt 192 lb 3.2 oz (87.2 kg)   BMI 30.10 kg/m    General: Well-nourished, well-developed in no acute distress. Accompanied by spouse Head: Normocephalic, atraumatic.   Eyes: Conjunctiva pink, no icterus. Mouth: Oropharyngeal mucosa moist and pink , no lesions erythema or exudate. Neck: Supple without thyromegaly, masses, or lymphadenopathy.  Lungs: Clear to auscultation bilaterally.  Heart: Regular rate and rhythm, no murmurs rubs or gallops.  Abdomen: Bowel sounds are normal, nontender, nondistended, no hepatosplenomegaly or masses, no abdominal bruits or    hernia , no rebound or guarding.   Rectal: Not performed Extremities: No lower extremity edema. No clubbing or deformities.  Neuro: Alert and oriented to person  Skin: Warm and dry, no rash or jaundice.   Psych: Alert and cooperative, normal mood and affect.  Labs: Lab Results  Component Value Date   CREATININE 0.81 07/29/2015   BUN 14 07/29/2015   NA 140 07/29/2015   K 4.0 07/29/2015   CL 103 07/29/2015   CO2 24 07/29/2015   Lab Results  Component Value Date   ALT 14 07/29/2015   AST 15 07/29/2015   ALKPHOS 90 07/29/2015   BILITOT <0.2 07/29/2015   Lab Results  Component Value Date   WBC 3.8 07/29/2015   HGB 12.3 07/24/2014   HCT 38.1 07/29/2015   MCV 94 07/29/2015   PLT 174 07/29/2015     Imaging Studies: No results found.

## 2016-03-24 NOTE — Assessment & Plan Note (Signed)
50 year old female with advanced dementia who presents with her spouse today for reported dark stools. Husband reports dark stool, [place] 1 in patients pull-up about a month ago. Only solids at one time. Since then, increasing dietary fiber and fiber supplement along with stool softener and stools are more regular and normal in color. Hemoccult status unknown. Hemoglobin unknown.  Patient spouse reports that he is somewhat colorblind. Unsure whether stool was black or dark brown. Given no further episodes, it was in the setting of constipation, suspect not true melena. Would be reasonable to check hemoglobin to look for anemia. Patient spouse does not want to pursue endoscopy at this time without further information necessitating it.   Regarding colon cancer screening, patient spouse prefers Cologuard option given her dementia and difficulty with bowel preparation. We will start process for her.

## 2016-03-25 NOTE — Progress Notes (Signed)
cc'ed to pcp °

## 2016-06-02 DIAGNOSIS — F411 Generalized anxiety disorder: Secondary | ICD-10-CM | POA: Diagnosis not present

## 2016-06-02 DIAGNOSIS — G3109 Other frontotemporal dementia: Secondary | ICD-10-CM | POA: Diagnosis not present

## 2016-07-10 ENCOUNTER — Ambulatory Visit (INDEPENDENT_AMBULATORY_CARE_PROVIDER_SITE_OTHER): Payer: BLUE CROSS/BLUE SHIELD | Admitting: Pediatrics

## 2016-07-10 ENCOUNTER — Encounter: Payer: Self-pay | Admitting: Pediatrics

## 2016-07-10 ENCOUNTER — Encounter (INDEPENDENT_AMBULATORY_CARE_PROVIDER_SITE_OTHER): Payer: Self-pay

## 2016-07-10 VITALS — BP 109/60 | HR 58 | Temp 97.2°F | Ht 67.0 in | Wt 188.0 lb

## 2016-07-10 DIAGNOSIS — Z0000 Encounter for general adult medical examination without abnormal findings: Secondary | ICD-10-CM | POA: Diagnosis not present

## 2016-07-10 DIAGNOSIS — F02818 Dementia in other diseases classified elsewhere, unspecified severity, with other behavioral disturbance: Secondary | ICD-10-CM

## 2016-07-10 DIAGNOSIS — F0281 Dementia in other diseases classified elsewhere with behavioral disturbance: Secondary | ICD-10-CM

## 2016-07-10 NOTE — Progress Notes (Signed)
  Subjective:   Patient ID: Courtney Allen, female    DOB: [birth year], 51 y.o.   MRN: [id number] CC: Annual Exam  HPI: Courtney Allen is a 51 y.o. female presenting for Annual Exam  Dementia: frontotemporal dementia Following with psychiatrist for behavior every 3 mo No recent changes to medications Husband with her today for annual exam  Has some decreased vision when light is low  In pull-ups for incontinence Needs help with dressing Pt says she has bene feeling well, no CP, no SOB, walks regularly with her husband  Normal stooling, no blood or constipation No prior colonoscopy  No falls  Relevant past medical, surgical, family and social history reviewed. Allergies and medications reviewed and updated. History  Smoking Status  . Never Smoker  Smokeless Tobacco  . Never Used   ROS: Per HPI   Objective:    BP 109/60   Pulse (!) 58   Temp 97.2 F (36.2 C) (Oral)   Ht _0  (1.702 m)   Wt 188 lb (85.3 kg)   BMI 29.44 kg/m   Wt Readings from Last 3 Encounters:  07/10/16 188 lb (85.3 kg)  03/24/16 192 lb 3.2 oz (87.2 kg)  02/21/16 193 lb (87.5 kg)    Gen: NAD, alert, cooperative with exam, NCAT EYES: EOMI, no conjunctival injection, or no icterus ENT:  TMs pearly gray b/l, OP without erythema LYMPH: no cervical LAD CV: NRRR, normal S1/S2, no murmur, distal pulses 2+ b/l Resp: CTABL, no wheezes, normal WOB Abd: +BS, soft, NTND. no guarding or organomegaly Ext: No edema, warm Neuro: Alert, answers questions mostly with "yes" or "no" or with echoing of question, yes and no usually appropriate  Assessment & Plan:  Courtney Allen was seen today for annual exam.  Diagnoses and all orders for this visit:  Encounter for preventive health examination Discussed lifestyle changes, cont walking regularly Husband wants to keep her at home as long as able No complaints today Due for colonoscopy, husband wants to get done Is unstable on feet at times due to vision, no  falls Discussed dexa scan, husband wants to wait -     Ambulatory referral to Gastroenterology -     BMP8+EGFR -     CBC with Differential/Platelet -     Lipid panel -     TSH  Dementia due to medical condition with behavioral disturbance Cont to follow with psychiatry Husband has found place in downtown [place] for day time care when he needs it Now husband working nights so he can be with her during day  Follow up plan: Return in about 6 months (around 01/10/2017). Assunta Found, MD [place]

## 2016-07-11 LAB — CBC WITH DIFFERENTIAL/PLATELET
Basophils Absolute: 0 10*3/uL (ref 0.0–0.2)
Basos: 1 %
EOS (ABSOLUTE): 0 10*3/uL (ref 0.0–0.4)
Eos: 1 %
Hematocrit: 36.4 % (ref 34.0–46.6)
Hemoglobin: 12.3 g/dL (ref 11.1–15.9)
Immature Grans (Abs): 0 10*3/uL (ref 0.0–0.1)
Immature Granulocytes: 0 %
Lymphocytes Absolute: 1.9 10*3/uL (ref 0.7–3.1)
Lymphs: 37 %
MCH: 31.1 pg (ref 26.6–33.0)
MCHC: 33.8 g/dL (ref 31.5–35.7)
MCV: 92 fL (ref 79–97)
Monocytes Absolute: 0.4 10*3/uL (ref 0.1–0.9)
Monocytes: 8 %
Neutrophils Absolute: 2.8 10*3/uL (ref 1.4–7.0)
Neutrophils: 53 %
Platelets: 179 10*3/uL (ref 150–379)
RBC: 3.95 x10E6/uL (ref 3.77–5.28)
RDW: 13.2 % (ref 12.3–15.4)
WBC: 5.2 10*3/uL (ref 3.4–10.8)

## 2016-07-11 LAB — LIPID PANEL
Chol/HDL Ratio: 3.4 ratio units (ref 0.0–4.4)
Cholesterol, Total: 179 mg/dL (ref 100–199)
HDL: 52 mg/dL (ref >39)
LDL Calculated: 117 mg/dL — ABNORMAL HIGH (ref 0–99)
Triglycerides: 51 mg/dL (ref 0–149)
VLDL Cholesterol Cal: 10 mg/dL (ref 5–40)

## 2016-07-11 LAB — BMP8+EGFR
BUN/Creatinine Ratio: 19 (ref 9–23)
BUN: 14 mg/dL (ref 6–24)
CO2: 23 mmol/L (ref 18–29)
Calcium: 9.2 mg/dL (ref 8.7–10.2)
Chloride: 104 mmol/L (ref 96–106)
Creatinine, Ser: 0.73 mg/dL (ref 0.57–1.00)
GFR calc Af Amer: 110 mL/min/{1.73_m2} (ref >59)
GFR calc non Af Amer: 96 mL/min/{1.73_m2} (ref >59)
Glucose: 88 mg/dL (ref 65–99)
Potassium: 4 mmol/L (ref 3.5–5.2)
Sodium: 142 mmol/L (ref 134–144)

## 2016-07-11 LAB — TSH: TSH: 2.46 u[IU]/mL (ref 0.450–4.500)

## 2016-07-17 ENCOUNTER — Telehealth: Payer: Self-pay | Admitting: Nurse Practitioner

## 2016-07-17 NOTE — Telephone Encounter (Signed)
Results mailed 

## 2016-08-28 ENCOUNTER — Other Ambulatory Visit: Payer: Self-pay | Admitting: Family

## 2017-01-27 ENCOUNTER — Ambulatory Visit (INDEPENDENT_AMBULATORY_CARE_PROVIDER_SITE_OTHER): Payer: BLUE CROSS/BLUE SHIELD

## 2017-01-27 DIAGNOSIS — Z23 Encounter for immunization: Secondary | ICD-10-CM

## 2017-02-04 DIAGNOSIS — F79 Unspecified intellectual disabilities: Secondary | ICD-10-CM | POA: Diagnosis not present

## 2017-02-04 DIAGNOSIS — F411 Generalized anxiety disorder: Secondary | ICD-10-CM | POA: Diagnosis not present

## 2017-02-04 DIAGNOSIS — G3109 Other frontotemporal dementia: Secondary | ICD-10-CM | POA: Diagnosis not present

## 2017-02-04 DIAGNOSIS — F4312 Post-traumatic stress disorder, chronic: Secondary | ICD-10-CM | POA: Diagnosis not present

## 2017-03-26 ENCOUNTER — Encounter: Payer: Self-pay | Admitting: Pediatrics

## 2017-03-26 ENCOUNTER — Ambulatory Visit: Payer: BLUE CROSS/BLUE SHIELD | Admitting: Pediatrics

## 2017-03-26 VITALS — BP 100/62 | HR 65 | Temp 98.0°F | Ht 67.0 in | Wt 167.0 lb

## 2017-03-26 DIAGNOSIS — H5711 Ocular pain, right eye: Secondary | ICD-10-CM

## 2017-03-26 DIAGNOSIS — H5789 Other specified disorders of eye and adnexa: Secondary | ICD-10-CM | POA: Diagnosis not present

## 2017-03-26 NOTE — Progress Notes (Signed)
  Subjective:   Patient ID: Nat Mathnita Canevari, female    DOB: June 08, 1965, 51 y.o.   MRN: [id number] CC: Eye Drainage  HPI: Nat Mathnita Cauthorn is a 51 y.o. female presenting for Eye Drainage here today with husband Pt with dementia, mobile, needs help with some ADLs Past two days has had some eye discharge, some runny nose and sore throat This morning woke up and R eye was bothering her so much she didn't want to open it Eye was crusted shut this morning Husband says she does open her L eye to walk In clinic now with both L and R eye shut  Says the light bothers the eye Pt repeatedly says eye hurts, hesitant with any intervention  No fevers Appetite fine until today per husband  Relevant past medical, surgical, family and social history reviewed. Allergies and medications reviewed and updated. Social History   Tobacco Use  Smoking Status Never Smoker  Smokeless Tobacco Never Used   ROS: Per HPI   Objective:    BP 100/62   Pulse 65   Temp 98 F (36.7 C) (Oral)   Ht 5\' 7"  (1.702 m)   Wt 167 lb (75.8 kg)   BMI 26.16 kg/m   Wt Readings from Last 3 Encounters:  03/26/17 167 lb (75.8 kg)  07/10/16 188 lb (85.3 kg)  03/24/16 192 lb 3.2 oz (87.2 kg)    Gen: sitting in chair with eyes shut tight, sometimes holding hands over eyes   EYES: L eye with minimal conjunctival injection when held open, R eye with green-brown crust along lid line, very red conjunctiva ENT:  TMs pearly gray b/l, OP without erythema LYMPH: no cervical LAD CV: NRRR, normal S1/S2, no murmur Resp: CTABL, no wheezes, normal WOB Ext: No edema, warm Neuro: Alert  Assessment & Plan:  Synetta Failnita was seen today for eye drainage.  Diagnoses and all orders for this visit:  Pain, eye, right Red eye Painful red eye, called local eye doctor who pt usually sees, needs to be seen today, pt going from here with husband to their office. Any trouble getting in to be seen let me know  Follow up plan: Return if symptoms  worsen or fail to improve. Rex Krasarol Shaasia Odle, MD [facility]

## 2017-04-02 ENCOUNTER — Telehealth: Payer: Self-pay | Admitting: *Deleted

## 2017-04-02 NOTE — Telephone Encounter (Signed)
Called to check on patient.  Husband Jonny Ruiz([name]) states that he took patient to opthalmology.  Patient was given eye drops and eye ointment.  Husband states that he has been having a hard time keeping the ointment on patient's eye due to not wanting to keep it shut but states that patient's eye is getting better.  Informed husband that if patient's eye fails to improve or gets any worse to contact this office or eye doctor.

## 2017-05-05 ENCOUNTER — Ambulatory Visit (INDEPENDENT_AMBULATORY_CARE_PROVIDER_SITE_OTHER): Payer: BLUE CROSS/BLUE SHIELD | Admitting: Pediatrics

## 2017-05-05 ENCOUNTER — Encounter: Payer: Self-pay | Admitting: Pediatrics

## 2017-05-05 VITALS — BP 129/71 | HR 73 | Temp 97.2°F | Ht 67.0 in | Wt 166.0 lb

## 2017-05-05 DIAGNOSIS — L249 Irritant contact dermatitis, unspecified cause: Secondary | ICD-10-CM

## 2017-05-05 MED ORDER — HYDROCORTISONE 2.5 % EX CREA: TOPICAL_CREAM | Freq: Two times a day (BID) | CUTANEOUS | 0 refills | Status: DC

## 2017-05-05 NOTE — Progress Notes (Addendum)
  Subjective:   Patient ID: Nat Mathnita Eriksen, female    DOB: [birth year], [age] y.o.   MRN: [id number] CC: Rash (Vaginal)  HPI: Nat Mathnita Baldyga is a 52 y.o. female presenting for Rash (Vaginal)  Rash in gluteal cleft started a couple weeks ago Here today with her husband who is her primary caregiver Patient with significant dementia, able to perform some ADLs but needs supervision  Started using jock itch medicine on the gluteal cleft rash about a week ago, made her itch more but he thinks it helps him to "dry it up" He thinks there has been minimal if any redness His daughter gave her a bath about the time the rash started He is not sure what products were used, patient does not usually take bathes  No fevers, no vaginal discharge No new sexual partners  Normal bowel movements No rash anywhere else on her body  Relevant past medical, surgical, family and social history reviewed. Allergies and medications reviewed and updated. Social History   Tobacco Use  Smoking Status Never Smoker  Smokeless Tobacco Never Used   ROS: Per HPI   Objective:    BP 129/71   Pulse 73   Temp (!) 97.2 F (36.2 C) (Oral)   Ht 5\' 7"  (1.702 m)   Wt 166 lb (75.3 kg)   BMI 26.00 kg/m   Wt Readings from Last 3 Encounters:  05/05/17 166 lb (75.3 kg)  03/26/17 167 lb (75.8 kg)  07/10/16 188 lb (85.3 kg)    Gen: NAD, alert, cooperative with exam, NCAT EYES: EOMI, no conjunctival injection, or no icterus CV: NRRR, normal S1/S2, no murmur, distal pulses 2+ b/l Resp: CTABL, no wheezes, normal WOB Abd: +BS, soft, NTND. Ext: No edema, warm Neuro: Alert, strength equal b/l UE and LE, coordination grossly normal MSK: normal muscle bulk Rectal: Normal rectum GU: Normal external female genitalia, no redness or irritation on external exam Skin: Proximal gluteal cleft with symmetric distribution of small flesh-colored papules in gluteal cleft, no redness, no weeping, no discharge  Assessment & Plan:    Synetta Failnita was seen today for rash.  Diagnoses and all orders for this visit:  Irritant contact dermatitis, unspecified trigger Trial of topical steroid, covered with Vaseline Avoid all other products If not improving within the next few days let me know -     hydrocortisone 2.5 % cream; Apply topically 2 (two) times daily.   Follow up plan: Return if symptoms worsen or fail to improve. Rex Krasarol Reneka Nebergall, MD [facility]

## 2017-05-05 NOTE — Patient Instructions (Signed)
Use steroid cream on affected area twice a day Cover with Vaseline as needed Avoid Desitin, any new fragrances, soaps, other products and area until rash resolves

## 2017-05-13 ENCOUNTER — Other Ambulatory Visit: Payer: Self-pay | Admitting: *Deleted

## 2017-05-13 DIAGNOSIS — F0281 Dementia in other diseases classified elsewhere with behavioral disturbance: Secondary | ICD-10-CM

## 2017-05-13 DIAGNOSIS — F02818 Dementia in other diseases classified elsewhere, unspecified severity, with other behavioral disturbance: Secondary | ICD-10-CM

## 2017-07-29 ENCOUNTER — Telehealth: Payer: Self-pay | Admitting: Pediatrics

## 2017-07-30 ENCOUNTER — Other Ambulatory Visit: Payer: Self-pay | Admitting: *Deleted

## 2017-07-30 ENCOUNTER — Encounter: Payer: Self-pay | Admitting: Pediatrics

## 2017-07-30 ENCOUNTER — Ambulatory Visit (INDEPENDENT_AMBULATORY_CARE_PROVIDER_SITE_OTHER): Payer: BLUE CROSS/BLUE SHIELD | Admitting: Pediatrics

## 2017-07-30 VITALS — BP 96/62 | HR 54 | Temp 97.6°F | Ht 67.0 in | Wt 161.4 lb

## 2017-07-30 DIAGNOSIS — Z0000 Encounter for general adult medical examination without abnormal findings: Secondary | ICD-10-CM | POA: Diagnosis not present

## 2017-07-30 DIAGNOSIS — F0391 Unspecified dementia with behavioral disturbance: Secondary | ICD-10-CM

## 2017-07-30 DIAGNOSIS — F03918 Unspecified dementia, unspecified severity, with other behavioral disturbance: Secondary | ICD-10-CM

## 2017-07-30 DIAGNOSIS — G4700 Insomnia, unspecified: Secondary | ICD-10-CM

## 2017-07-30 MED ORDER — QUETIAPINE FUMARATE 25 MG PO TABS: 25.0000 mg | ORAL_TABLET | Freq: Every day | ORAL | 5 refills | Status: DC

## 2017-07-30 MED ORDER — BUPROPION HCL ER (XL) 150 MG PO TB24: 150.0000 mg | ORAL_TABLET | Freq: Every day | ORAL | 3 refills | Status: DC

## 2017-07-30 MED ORDER — MIRTAZAPINE 15 MG PO TABS: 30.0000 mg | ORAL_TABLET | Freq: Every day | ORAL | 3 refills | Status: DC

## 2017-07-30 MED ORDER — CARBAMAZEPINE 200 MG PO TABS: 200.0000 mg | ORAL_TABLET | Freq: Two times a day (BID) | ORAL | 5 refills | Status: DC

## 2017-07-30 NOTE — Progress Notes (Signed)
  Subjective:   Patient ID: Courtney Allen, female    DOB: [birth year], 52 y.o.   MRN: [id number] CC: Annual Exam  HPI: Martine Bleecker is a 52 y.o. female presenting for Annual Exam  Sees psychiatrist in [place] at [facility] for behavioral disturbances related to dementia. Also following with neurology.  Here today with her husband. Pt significantly limited due to dementia. Echos back some words and phrases. Husband primary caregiver. Family lives in area, daughters sometimes help. Husband provides history as pt is not able. Husband says overall she has been fine. He has not noticed any significant worsening in behavior or her health since last visit. He keeps the home safe, unplugging anything she could turn on that would be dangerous if he isnt immediately available. She is sleeping well through the night on the mirtazepine.  At last mammogram husband says he had to hold her down, she was quite upset, yelling. He would like to stop breast cancer screenings. No family history of breast cancer.  Relevant past medical, surgical, family and social history reviewed. Allergies and medications reviewed and updated. Social History   Tobacco Use  Smoking Status Never Smoker  Smokeless Tobacco Never Used   ROS: All systems negative other than what is in the HPI  Objective:    BP 96/62   Pulse (!) 54   Temp 97.6 F (36.4 C) (Oral)   Ht _0  (1.702 m)   Wt 161 lb 6.4 oz (73.2 kg)   BMI 25.28 kg/m   Wt Readings from Last 3 Encounters:  07/30/17 161 lb 6.4 oz (73.2 kg)  05/05/17 166 lb (75.3 kg)  03/26/17 167 lb (75.8 kg)    Gen: NAD, alert, redirectable, follows directions with prompting, cooperative with exam, NCAT EYES: EOMI, no conjunctival injection, or no icterus ENT:  TMs pearly gray b/l, OP without erythema LYMPH: no cervical LAD CV: NRRR, normal S1/S2, no murmur, distal pulses 2+ b/l Resp: CTABL, no wheezes, normal WOB Abd: +BS, soft, NTND. no guarding or  organomegaly Ext: No edema, warm Neuro: Alert and oriented, strength equal b/l UE and LE, coordination grossly normal MSK: normal muscle bulk  Assessment & Plan:  Rhian was seen today for annual exam.  Diagnoses and all orders for this visit:  Encounter for preventive care Will stop mammograms for screening, pt doesn't understand proceed and last one was traumatic -     Lipid panel -     CMP14+EGFR -     TSH  Dementia with behavioral disturbance Stable, follows with neurology and psychiatry. Gave husband information for local dementia support. -     buPROPion (WELLBUTRIN XL) 150 MG 24 hr tablet; Take 1 tablet (150 mg total) by mouth daily. -     carbamazepine (TEGRETOL) 200 MG tablet; Take 1 tablet (200 mg total) by mouth 2 (two) times daily. -     QUEtiapine (SEROQUEL) 25 MG tablet; Take 1 tablet (25 mg total) by mouth at bedtime.  Insomnia -     mirtazapine (REMERON) 15 MG tablet; Take 2 tablets (30 mg total) by mouth at bedtime. Take one at bedtime po.   Follow up plan: Return in about 6 months (around 01/30/2018). Assunta Found, MD [place]

## 2017-07-31 LAB — CMP14+EGFR
ALT: 10 IU/L (ref 0–32)
AST: 10 IU/L (ref 0–40)
Albumin/Globulin Ratio: 2.2 (ref 1.2–2.2)
Albumin: 4.6 g/dL (ref 3.5–5.5)
Alkaline Phosphatase: 83 IU/L (ref 39–117)
BUN/Creatinine Ratio: 12 (ref 9–23)
BUN: 10 mg/dL (ref 6–24)
Bilirubin Total: 0.3 mg/dL (ref 0.0–1.2)
CO2: 23 mmol/L (ref 20–29)
Calcium: 9.2 mg/dL (ref 8.7–10.2)
Chloride: 103 mmol/L (ref 96–106)
Creatinine, Ser: 0.85 mg/dL (ref 0.57–1.00)
GFR calc Af Amer: 91 mL/min/{1.73_m2} (ref >59)
GFR calc non Af Amer: 79 mL/min/{1.73_m2} (ref >59)
Globulin, Total: 2.1 g/dL (ref 1.5–4.5)
Glucose: 84 mg/dL (ref 65–99)
Potassium: 3.9 mmol/L (ref 3.5–5.2)
Sodium: 140 mmol/L (ref 134–144)
Total Protein: 6.7 g/dL (ref 6.0–8.5)

## 2017-07-31 LAB — TSH: TSH: 1.73 u[IU]/mL (ref 0.450–4.500)

## 2017-07-31 LAB — LIPID PANEL
Chol/HDL Ratio: 3.1 ratio (ref 0.0–4.4)
Cholesterol, Total: 181 mg/dL (ref 100–199)
HDL: 59 mg/dL (ref >39)
LDL Calculated: 106 mg/dL — ABNORMAL HIGH (ref 0–99)
Triglycerides: 79 mg/dL (ref 0–149)
VLDL Cholesterol Cal: 16 mg/dL (ref 5–40)

## 2017-08-03 DIAGNOSIS — F79 Unspecified intellectual disabilities: Secondary | ICD-10-CM | POA: Diagnosis not present

## 2017-08-03 DIAGNOSIS — F4312 Post-traumatic stress disorder, chronic: Secondary | ICD-10-CM | POA: Diagnosis not present

## 2017-08-03 DIAGNOSIS — F411 Generalized anxiety disorder: Secondary | ICD-10-CM | POA: Diagnosis not present

## 2017-08-03 DIAGNOSIS — F0281 Dementia in other diseases classified elsewhere with behavioral disturbance: Secondary | ICD-10-CM | POA: Diagnosis not present

## 2017-08-03 NOTE — Telephone Encounter (Signed)
Patient seen in the office with husband

## 2017-09-06 ENCOUNTER — Other Ambulatory Visit: Payer: Self-pay | Admitting: Family

## 2017-09-06 ENCOUNTER — Telehealth: Payer: Self-pay | Admitting: Nurse Practitioner

## 2017-09-06 ENCOUNTER — Other Ambulatory Visit: Payer: Self-pay | Admitting: *Deleted

## 2017-09-06 DIAGNOSIS — G4700 Insomnia, unspecified: Secondary | ICD-10-CM

## 2017-09-06 MED ORDER — MIRTAZAPINE 30 MG PO TABS: 30.0000 mg | ORAL_TABLET | Freq: Every day | ORAL | 1 refills | Status: DC

## 2017-09-06 NOTE — Telephone Encounter (Signed)
Husband called stating that patient takes s of remeron and it is easier to give one pill rather than 2.   sent to pharmacy

## 2017-09-11 ENCOUNTER — Telehealth: Payer: Self-pay | Admitting: Nurse Practitioner

## 2017-09-11 NOTE — Telephone Encounter (Signed)
Spoke to pt's husband and advised I would have to send this to Dr Oswaldo Done as this medication isn't on the active medication list to be taken daily. Pt's husband states she usually takes it daily to prevent UTI. He is aware that Dr Oswaldo Done out of office until Monday. Please advise?

## 2017-09-13 MED ORDER — NITROFURANTOIN MONOHYD MACRO 100 MG PO CAPS: 100.0000 mg | ORAL_CAPSULE | Freq: Every day | ORAL | 1 refills | Status: AC

## 2017-09-13 NOTE — Telephone Encounter (Signed)
Sent in

## 2017-09-14 DIAGNOSIS — Z6825 Body mass index (BMI) 25.0-25.9, adult: Secondary | ICD-10-CM | POA: Diagnosis not present

## 2017-09-14 DIAGNOSIS — F0390 Unspecified dementia without behavioral disturbance: Secondary | ICD-10-CM | POA: Diagnosis not present

## 2017-09-15 NOTE — Telephone Encounter (Signed)
Husband aware.

## 2017-09-23 ENCOUNTER — Telehealth: Payer: Self-pay | Admitting: Nurse Practitioner

## 2017-12-07 ENCOUNTER — Other Ambulatory Visit: Payer: BLUE CROSS/BLUE SHIELD

## 2017-12-10 ENCOUNTER — Ambulatory Visit: Payer: BLUE CROSS/BLUE SHIELD | Admitting: Pediatrics

## 2017-12-10 ENCOUNTER — Encounter: Payer: Self-pay | Admitting: Family

## 2017-12-10 ENCOUNTER — Ambulatory Visit: Payer: BLUE CROSS/BLUE SHIELD | Admitting: Family

## 2017-12-10 VITALS — BP 88/58 | HR 62 | Temp 97.7°F | Ht <= 58 in | Wt 158.2 lb

## 2017-12-10 DIAGNOSIS — M545 Low back pain, unspecified: Secondary | ICD-10-CM

## 2017-12-10 DIAGNOSIS — F0281 Dementia in other diseases classified elsewhere with behavioral disturbance: Secondary | ICD-10-CM | POA: Diagnosis not present

## 2017-12-10 DIAGNOSIS — I959 Hypotension, unspecified: Secondary | ICD-10-CM

## 2017-12-10 DIAGNOSIS — F02818 Dementia in other diseases classified elsewhere, unspecified severity, with other behavioral disturbance: Secondary | ICD-10-CM

## 2017-12-10 DIAGNOSIS — R3981 Functional urinary incontinence: Secondary | ICD-10-CM

## 2017-12-10 NOTE — Progress Notes (Signed)
   Subjective:    Patient ID: Courtney Allen, female    DOB: 1965-11-11, 52 y.o.   MRN: [id number]  Chief Complaint  Patient presents with  . recheck on UTI    Back Pain  This is a new problem. The current episode started 1 to 4 weeks ago. The problem occurs intermittently. The problem has been waxing and waning since onset. The pain is present in the lumbar spine. Associated symptoms include bladder incontinence. Pertinent negatives include no dysuria. Risk factors include sedentary lifestyle.      Review of Systems  Unable to perform ROS: Dementia  Genitourinary: Positive for bladder incontinence. Negative for dysuria.  Musculoskeletal: Positive for back pain.  All other systems reviewed and are negative.      Objective:   Physical Exam  Constitutional: Courtney Allen is oriented to person, place, and time. Courtney Allen appears well-developed. No distress.  HENT:  Head: Normocephalic and atraumatic.  Eyes: Pupils are equal, round, and reactive to light.  Neck: Normal range of motion. Neck supple. No thyromegaly present.  Cardiovascular: Normal rate, regular rhythm, normal heart sounds and intact distal pulses.  No murmur heard. Pulmonary/Chest: Effort normal and breath sounds normal. No respiratory distress. Courtney Allen has no wheezes.  Abdominal: Soft. Bowel sounds are normal. Courtney Allen exhibits no distension. There is no tenderness.  Musculoskeletal: Normal range of motion. Courtney Allen exhibits no edema or tenderness.  Neurological: Courtney Allen is alert and oriented to person, place, and time. Courtney Allen has normal reflexes. No cranial nerve deficit.  Skin: Skin is warm and dry.  Psychiatric: Thought content normal. Courtney Allen mood appears anxious. Courtney Allen is hyperactive. Cognition and memory are impaired. Courtney Allen expresses inappropriate judgment. Courtney Allen is noncommunicative. Courtney Allen exhibits abnormal remote memory.  Vitals reviewed.     BP (!) 88/58   Pulse 62   Temp 97.7 F (36.5 C) (Oral)   Ht 1' (0.305 m)   Wt 158 lb 3.2 oz (71.8 kg)    HC 67" (170.2 cm)   BMI 772.41 kg/m      Assessment & Plan:  Courtney Allen comes in today with chief complaint of recheck on UTI   Diagnosis and orders addressed:  1. Urinary incontinence due to cognitive impairment - Urinalysis, Complete - Urine Culture - CBC with Differential/Platelet - BMP8+EGFR  2. Dementia due to medical condition with behavioral disturbance - Urinalysis, Complete - Urine Culture - CBC with Differential/Platelet - BMP8+EGFR  3. Acute bilateral low back pain without sciatica - Urinalysis, Complete - Urine Culture - CBC with Differential/Platelet - BMP8+EGFR  4. Hypotension, unspecified hypotension type Force fluids  Labs pending Force fluids Keep follow up with PCP   Evelina Dun, FNP

## 2017-12-10 NOTE — Patient Instructions (Signed)
Hypotension Hypotension, commonly called low blood pressure, is when the force of blood pumping through your arteries is too weak. Arteries are blood vessels that carry blood from the heart throughout the body. When blood pressure is too low, you may not get enough blood to your brain or to the rest of your organs. This can cause weakness, light-headedness, rapid heartbeat, and fainting. Depending on the cause and severity, hypotension may be harmless (benign) or cause serious problems (critical). What are the causes? Possible causes of hypotension include:  Blood loss.  Loss of body fluids (dehydration).  Heart problems.  Hormone (endocrine) problems.  Pregnancy.  Severe infection.  Lack of certain nutrients.  Severe allergic reactions (anaphylaxis).  Certain medicines, such as blood pressure medicine or medicines that make the body lose excess fluids (diuretics). Sometimes, hypotension can be caused by not taking medicine as directed, such as taking too much of a certain medicine.  What increases the risk? Certain factors can make you more likely to develop hypotension, including:  Age. Risk increases as you get older.  Conditions that affect the heart or the central nervous system.  Taking certain medicines, such as blood pressure medicine or diuretics.  Being pregnant.  What are the signs or symptoms? Symptoms of this condition may include:  Weakness.  Light-headedness.  Dizziness.  Blurred vision.  Fatigue.  Rapid heartbeat.  Fainting, in severe cases.  How is this diagnosed? This condition is diagnosed based on:  Your medical history.  Your symptoms.  Your blood pressure measurement. Your health care provider will check your blood pressure when you are: ? Lying down. ? Sitting. ? Standing.  A blood pressure reading is recorded as two numbers, such as "120 over 80" (or 120/80). The first ("top") number is called the systolic pressure. It is a  measure of the pressure in your arteries as your heart beats. The second ("bottom") number is called the diastolic pressure. It is a measure of the pressure in your arteries when your heart relaxes between beats. Blood pressure is measured in a unit called mm Hg. Healthy blood pressure for adults is 120/80. If your blood pressure is below 90/60, you may be diagnosed with hypotension. Other information or tests that may be used to diagnose hypotension include:  Your other vital signs, such as your heart rate and temperature.  Blood tests.  Tilt table test. For this test, you will be safely secured to a table that moves you from a lying position to an upright position. Your heart rhythm and blood pressure will be monitored during the test.  How is this treated? Treatment for this condition may include:  Changing your diet. This may involve eating more salt (sodium) or drinking more water.  Taking medicines to raise your blood pressure.  Changing the dosage of certain medicines you are taking that might be lowering your blood pressure.  Wearing compression stockings. These stockings help to prevent blood clots and reduce swelling in your legs.  In some cases, you may need to go to the hospital for:  Fluid replacement. This means you will receive fluids through an IV tube.  Blood replacement. This means you will receive donated blood through an IV tube (transfusion).  Treating an infection or heart problems, if this applies.  Monitoring. You may need to be monitored while medicines that you are taking wear off.  Follow these instructions at home: Eating and drinking   Drink enough fluid to keep your urine clear or pale yellow.    Eat a healthy diet and follow instructions from your health care provider about eating or drinking restrictions. A healthy diet includes: ? Fresh fruits and vegetables. ? Whole grains. ? Lean meats. ? Low-fat dairy products.  Eat extra salt only as  directed. Do not add extra salt to your diet unless your health care provider told you to do that.  Eat frequent, small meals.  Avoid standing up suddenly after eating. Medicines  Take over-the-counter and prescription medicines only as told by your health care provider. ? Follow instructions from your health care provider about changing the dosage of your current medicines, if this applies. ? Do not stop or adjust any of your medicines on your own. General instructions  Wear compression stockings as told by your health care provider.  Get up slowly from lying down or sitting positions. This gives your blood pressure a chance to adjust.  Avoid hot showers and excessive heat as directed by your health care provider.  Return to your normal activities as told by your health care provider. Ask your health care provider what activities are safe for you.  Do not use any products that contain nicotine or tobacco, such as cigarettes and e-cigarettes. If you need help quitting, ask your health care provider.  Keep all follow-up visits as told by your health care provider. This is important. Contact a health care provider if:  You vomit.  You have diarrhea.  You have a fever for more than 2-3 days.  You feel more thirsty than usual.  You feel weak and tired. Get help right away if:  You have chest pain.  You have a fast or irregular heartbeat.  You develop numbness in any part of your body.  You cannot move your arms or your legs.  You have trouble speaking.  You become sweaty or feel light-headed.  You faint.  You feel short of breath.  You have trouble staying awake.  You feel confused. This information is not intended to replace advice given to you by your health care provider. Make sure you discuss any questions you have with your health care provider. Document Released: 04/20/2005 Document Revised: 11/08/2015 Document Reviewed: 10/10/2015 Elsevier Interactive  Patient Education  2018 [employer] Inc[employer]  

## 2017-12-11 LAB — CBC WITH DIFFERENTIAL/PLATELET
Basophils Absolute: 0 10*3/uL (ref 0.0–0.2)
Basos: 1 %
EOS (ABSOLUTE): 0 10*3/uL (ref 0.0–0.4)
Eos: 1 %
Hematocrit: 36.7 % (ref 34.0–46.6)
Hemoglobin: 12.4 g/dL (ref 11.1–15.9)
Immature Grans (Abs): 0 10*3/uL (ref 0.0–0.1)
Immature Granulocytes: 0 %
Lymphocytes Absolute: 1.5 10*3/uL (ref 0.7–3.1)
Lymphs: 33 %
MCH: 31.5 pg (ref 26.6–33.0)
MCHC: 33.8 g/dL (ref 31.5–35.7)
MCV: 93 fL (ref 79–97)
Monocytes Absolute: 0.4 10*3/uL (ref 0.1–0.9)
Monocytes: 9 %
Neutrophils Absolute: 2.6 10*3/uL (ref 1.4–7.0)
Neutrophils: 56 %
Platelets: 169 10*3/uL (ref 150–450)
RBC: 3.94 x10E6/uL (ref 3.77–5.28)
RDW: 12.7 % (ref 12.3–15.4)
WBC: 4.6 10*3/uL (ref 3.4–10.8)

## 2017-12-11 LAB — BMP8+EGFR
BUN/Creatinine Ratio: 16 (ref 9–23)
BUN: 14 mg/dL (ref 6–24)
CO2: 23 mmol/L (ref 20–29)
Calcium: 9.6 mg/dL (ref 8.7–10.2)
Chloride: 99 mmol/L (ref 96–106)
Creatinine, Ser: 0.88 mg/dL (ref 0.57–1.00)
GFR calc Af Amer: 87 mL/min/{1.73_m2} (ref >59)
GFR calc non Af Amer: 76 mL/min/{1.73_m2} (ref >59)
Glucose: 88 mg/dL (ref 65–99)
Potassium: 4.3 mmol/L (ref 3.5–5.2)
Sodium: 136 mmol/L (ref 134–144)

## 2017-12-13 ENCOUNTER — Other Ambulatory Visit: Payer: Self-pay | Admitting: Family

## 2017-12-13 MED ORDER — CIPROFLOXACIN HCL 500 MG PO TABS: 500.0000 mg | ORAL_TABLET | Freq: Two times a day (BID) | ORAL | 0 refills | Status: DC

## 2017-12-14 LAB — URINALYSIS, COMPLETE
Bilirubin, UA: NEGATIVE
Glucose, UA: NEGATIVE
Ketones, UA: NEGATIVE
Nitrite, UA: NEGATIVE
Protein, UA: NEGATIVE
RBC, UA: NEGATIVE
Specific Gravity, UA: 1.015 (ref 1.005–1.030)
Urobilinogen, Ur: 0.2 mg/dL (ref 0.2–1.0)
pH, UA: 6 (ref 5.0–7.5)

## 2017-12-14 LAB — MICROSCOPIC EXAMINATION
Epithelial Cells (non renal): >10 /hpf — AB (ref 0–10)
RBC, UA: NONE SEEN /hpf (ref 0–2)
Renal Epithel, UA: NONE SEEN /hpf

## 2017-12-16 LAB — URINE CULTURE

## 2018-01-05 ENCOUNTER — Other Ambulatory Visit: Payer: BLUE CROSS/BLUE SHIELD

## 2018-01-27 DIAGNOSIS — G3109 Other frontotemporal dementia: Secondary | ICD-10-CM | POA: Diagnosis not present

## 2018-01-27 DIAGNOSIS — F4312 Post-traumatic stress disorder, chronic: Secondary | ICD-10-CM | POA: Diagnosis not present

## 2018-01-27 DIAGNOSIS — F411 Generalized anxiety disorder: Secondary | ICD-10-CM | POA: Diagnosis not present

## 2018-02-04 ENCOUNTER — Ambulatory Visit: Payer: BLUE CROSS/BLUE SHIELD | Admitting: Pediatrics

## 2018-02-04 ENCOUNTER — Encounter: Payer: Self-pay | Admitting: Pediatrics

## 2018-02-04 VITALS — BP 102/63 | HR 60 | Temp 98.0°F | Ht 67.0 in | Wt 156.8 lb

## 2018-02-04 DIAGNOSIS — Z23 Encounter for immunization: Secondary | ICD-10-CM | POA: Diagnosis not present

## 2018-02-04 DIAGNOSIS — B372 Candidiasis of skin and nail: Secondary | ICD-10-CM | POA: Diagnosis not present

## 2018-02-04 MED ORDER — FLUCONAZOLE 150 MG PO TABS: 150.0000 mg | ORAL_TABLET | Freq: Once | ORAL | 0 refills | Status: AC

## 2018-02-04 MED ORDER — NYSTATIN 100000 UNIT/GM EX OINT: 1.0000 "application " | TOPICAL_OINTMENT | Freq: Two times a day (BID) | CUTANEOUS | 1 refills | Status: DC

## 2018-02-04 NOTE — Progress Notes (Signed)
  Subjective:   Patient ID: Courtney Allen, female    DOB: [birth year], 52 y.o.   MRN: [id number] CC: Rash (Buttocks, 5 days, worsened)  HPI: Courtney Allen is a 52 y.o. female   Patient with dementia, here today with her caregiver  Red bumps over buttocks, started 5 days ago.  Does not seem to bother, is not itching.  She is mostly incontinent, wears depends.  Has to be reminded to get to the bathroom, sometimes she will go appropriately, sometimes is incontinent. Husband worried it is STI.  No vaginal discharge.  No other rash in the area.  No new sexual exposures for her or husband that he is aware of.  Offered testing for STIs, husband declines says he may be interested in future.  She has not had any fevers.  Appetite is been fine.  She has otherwise been acting her usual self.  Relevant past medical, surgical, family and social history reviewed. Allergies and medications reviewed and updated. Social History   Tobacco Use  Smoking Status Never Smoker  Smokeless Tobacco Never Used   ROS: Per HPI   Objective:    BP 102/63   Pulse 60   Temp 98 F (36.7 C) (Oral)   Ht 5\' 7"  (1.702 m)   Wt 156 lb 12.8 oz (71.1 kg)   BMI 24.56 kg/m   Wt Readings from Last 3 Encounters:  02/04/18 156 lb 12.8 oz (71.1 kg)  12/10/17 158 lb 3.2 oz (71.8 kg)  07/30/17 161 lb 6.4 oz (73.2 kg)    Gen: NAD, alert, cooperative with exam, NCAT EYES: EOMI, no conjunctival injection, or no icterus CV: NRRR, normal S1/S2, no murmur, distal pulses 2+ b/l Resp: CTABL, no wheezes, normal WOB Ext: No edema, warm Neuro: Alert, repeat sense of phrases.  Does not appropriately answer yes or no.  Follows directions with prompting Skin: Red 1 to 2 mm papules scattered over lower buttocks, upper posterior legs in undergarment distribution.  Symmetric.  Assessment & Plan:  Cyd was seen today for rash.  Diagnoses and all orders for this visit:  Candidal skin infection -     fluconazole (DIFLUCAN) 150  MG tablet; Take 1 tablet (150 mg total) by mouth once for 1 dose. -     nystatin ointment (MYCOSTATIN); Apply 1 application topically 2 (two) times daily.   Follow up plan: Return in about 3 months (around 05/07/2018). Rex Kras, MD [place] [facility]

## 2018-02-14 DIAGNOSIS — K130 Diseases of lips: Secondary | ICD-10-CM | POA: Diagnosis not present

## 2018-02-14 DIAGNOSIS — L814 Other melanin hyperpigmentation: Secondary | ICD-10-CM | POA: Diagnosis not present

## 2018-03-20 ENCOUNTER — Other Ambulatory Visit: Payer: Self-pay | Admitting: Pediatrics

## 2018-03-25 DIAGNOSIS — H3552 Pigmentary retinal dystrophy: Secondary | ICD-10-CM | POA: Diagnosis not present

## 2018-03-28 ENCOUNTER — Ambulatory Visit: Payer: BLUE CROSS/BLUE SHIELD | Admitting: Pediatrics

## 2018-03-28 ENCOUNTER — Encounter: Payer: Self-pay | Admitting: Pediatrics

## 2018-03-28 VITALS — BP 106/76 | HR 66 | Temp 97.4°F | Ht 67.0 in | Wt 157.6 lb

## 2018-03-28 DIAGNOSIS — G8929 Other chronic pain: Secondary | ICD-10-CM

## 2018-03-28 DIAGNOSIS — M545 Low back pain: Secondary | ICD-10-CM | POA: Diagnosis not present

## 2018-03-28 DIAGNOSIS — R399 Unspecified symptoms and signs involving the genitourinary system: Secondary | ICD-10-CM | POA: Diagnosis not present

## 2018-03-28 DIAGNOSIS — N390 Urinary tract infection, site not specified: Secondary | ICD-10-CM

## 2018-03-28 MED ORDER — NITROFURANTOIN MONOHYD MACRO 100 MG PO CAPS: 100.0000 mg | ORAL_CAPSULE | Freq: Every day | ORAL | 1 refills | Status: DC

## 2018-03-28 NOTE — Progress Notes (Signed)
  Subjective:   Patient ID: Courtney Allen, female    DOB: 1965-06-03, 52 y.o.   MRN: [id number] CC: Back Pain (Lower)  HPI: Courtney Mathnita Rominger is a 52 y.o. female   Patient with dementia.  Here today with her husband who is her primary caregiver.  History is from him.  Patient does answer yes and no to some questions.  He says she has been complaining about back pain off and on for a while.  Sometimes this means she has a urinary tract infection.  Her hygiene is not ideal with toileting, often wiping back to front with stooling.  She is at times incontinent.  She wears depends for undergarments.  He says she answered yes yesterday when he asked her if it burns with urination.  No fevers at home.  Appetite is been normal.  She was on chronic nitrofurantoin for recurrent UTIs up until they ran out about a month ago.  He has for refill for this.  He does think it was helping decrease to overall rate of urinary tract infections.  She is been complaining of the back pain off and on for months.  Usually points to the middle of her lower back with where the pain is.  Some days she does fine.  Other day she talks about the pain.  Relevant past medical, surgical, family and social history reviewed. Allergies and medications reviewed and updated. Social History   Tobacco Use  Smoking Status Never Smoker  Smokeless Tobacco Never Used   ROS: Per HPI   Objective:    BP 106/76   Pulse 66   Temp (!) 97.4 F (36.3 C) (Oral)   Ht 5\' 7"  (1.702 m)   Wt 157 lb 9.6 oz (71.5 kg)   BMI 24.68 kg/m   Wt Readings from Last 3 Encounters:  03/28/18 157 lb 9.6 oz (71.5 kg)  02/04/18 156 lb 12.8 oz (71.1 kg)  12/10/17 158 lb 3.2 oz (71.8 kg)    Gen: NAD, alert, cooperative with exam, NCAT EYES: EOMI, no conjunctival injection, or no icterus ENT:  OP without erythema LYMPH: no cervical LAD CV: NRRR, normal S1/S2, no murmur, distal pulses 2+ b/l Resp: CTABL, no wheezes, normal WOB Abd: +BS, soft,  NTND.  Ext: No edema, warm Neuro: Alert and oriented, strength equal b/l UE and LE MSK: No point tenderness over spine.  Mildly tender to palpation right lower back paraspinal muscles.  Assessment & Plan:  Synetta Failnita was seen today for back pain.  Diagnoses and all orders for this visit:  UTI symptoms Husband will bring back urinalysis from home, difficult getting sample from her here. -     Urinalysis, Complete; Future -     Urine Culture; Future  Frequent UTI Restart nitrofurantoin for suppression. -     nitrofurantoin, macrocrystal-monohydrate, (MACROBID) 100 MG capsule; Take 1 capsule (100 mg total) by mouth at bedtime. 1 po BId  Chronic midline low back pain without sciatica Gave gentle back exercises.  If not improving let me know.  Follow up plan: Return in about 3 months (around 06/28/2018). Rex Krasarol Assia Meanor, MD [facility]

## 2018-03-28 NOTE — Patient Instructions (Signed)
Back Exercises If you have pain in your back, do these exercises 2-3 times each day or as told by your doctor. When the pain goes away, do the exercises once each day, but repeat the steps more times for each exercise (do more repetitions). If you do not have pain in your back, do these exercises once each day or as told by your doctor. Exercises Single Knee to Chest  Do these steps 3-5 times in a row for each leg: 1. Lie on your back on a firm bed or the floor with your legs stretched out. 2. Bring one knee to your chest. 3. Hold your knee to your chest by grabbing your knee or thigh. 4. Pull on your knee until you feel a gentle stretch in your lower back. 5. Keep doing the stretch for 10-30 seconds. 6. Slowly let go of your leg and straighten it.  Pelvic Tilt  Do these steps 5-10 times in a row: 1. Lie on your back on a firm bed or the floor with your legs stretched out. 2. Bend your knees so they point up to the ceiling. Your feet should be flat on the floor. 3. Tighten your lower belly (abdomen) muscles to press your lower back against the floor. This will make your tailbone point up to the ceiling instead of pointing down to your feet or the floor. 4. Stay in this position for 5-10 seconds while you gently tighten your muscles and breathe evenly.  Cat-Cow  Do these steps until your lower back bends more easily: 1. Get on your hands and knees on a firm surface. Keep your hands under your shoulders, and keep your knees under your hips. You may put padding under your knees. 2. Let your head hang down, and make your tailbone point down to the floor so your lower back is round like the back of a cat. 3. Stay in this position for 5 seconds. 4. Slowly lift your head and make your tailbone point up to the ceiling so your back hangs low (sags) like the back of a cow. 5. Stay in this position for 5 seconds.  Press-Ups  Do these steps 5-10 times in a row: 1. Lie on your belly (face-down)  on the floor. 2. Place your hands near your head, about shoulder-width apart. 3. While you keep your back relaxed and keep your hips on the floor, slowly straighten your arms to raise the top half of your body and lift your shoulders. Do not use your back muscles. To make yourself more comfortable, you may change where you place your hands. 4. Stay in this position for 5 seconds. 5. Slowly return to lying flat on the floor.  Bridges  Do these steps 10 times in a row: 1. Lie on your back on a firm surface. 2. Bend your knees so they point up to the ceiling. Your feet should be flat on the floor. 3. Tighten your butt muscles and lift your butt off of the floor until your waist is almost as high as your knees. If you do not feel the muscles working in your butt and the back of your thighs, slide your feet 1-2 inches farther away from your butt. 4. Stay in this position for 3-5 seconds. 5. Slowly lower your butt to the floor, and let your butt muscles relax.  If this exercise is too easy, try doing it with your arms crossed over your chest. Back Lifts Do these steps 5-10 times in a   row: 1. Lie on your belly (face-down) with your arms at your sides, and rest your forehead on the floor. 2. Tighten the muscles in your legs and your butt. 3. Slowly lift your chest off of the floor while you keep your hips on the floor. Keep the back of your head in line with the curve in your back. Look at the floor while you do this. 4. Stay in this position for 3-5 seconds. 5. Slowly lower your chest and your face to the floor.  Contact a doctor if:  Your back pain gets a lot worse when you do an exercise.  Your back pain does not lessen 2 hours after you exercise. If you have any of these problems, stop doing the exercises. Do not do them again unless your doctor says it is okay. Get help right away if:  You have sudden, very bad back pain. If this happens, stop doing the exercises. Do not do them again  unless your doctor says it is okay. This information is not intended to replace advice given to you by your health care provider. Make sure you discuss any questions you have with your health care provider. Document Released: 05/23/2010 Document Revised: 09/26/2015 Document Reviewed: 06/14/2014 Elsevier Interactive Patient Education  2018 [employer]   

## 2018-04-01 ENCOUNTER — Other Ambulatory Visit: Payer: BLUE CROSS/BLUE SHIELD

## 2018-04-01 DIAGNOSIS — R399 Unspecified symptoms and signs involving the genitourinary system: Secondary | ICD-10-CM

## 2018-04-01 LAB — MICROSCOPIC EXAMINATION: WBC, UA: >30 /hpf — AB (ref 0–5)

## 2018-04-01 LAB — URINALYSIS, COMPLETE
Bilirubin, UA: NEGATIVE
Glucose, UA: NEGATIVE
Ketones, UA: NEGATIVE
Nitrite, UA: NEGATIVE
RBC, UA: NEGATIVE
Specific Gravity, UA: 1.01 (ref 1.005–1.030)
Urobilinogen, Ur: 0.2 mg/dL (ref 0.2–1.0)
pH, UA: >8 — ABNORMAL HIGH (ref 5.0–7.5)

## 2018-04-04 ENCOUNTER — Other Ambulatory Visit: Payer: Self-pay | Admitting: Pediatrics

## 2018-04-04 LAB — URINE CULTURE

## 2018-04-04 MED ORDER — CEPHALEXIN 500 MG PO CAPS: 500.0000 mg | ORAL_CAPSULE | Freq: Two times a day (BID) | ORAL | 0 refills | Status: DC

## 2018-05-13 ENCOUNTER — Encounter: Payer: Self-pay | Admitting: Family Medicine

## 2018-05-13 ENCOUNTER — Ambulatory Visit: Payer: BLUE CROSS/BLUE SHIELD | Admitting: Family Medicine

## 2018-05-13 VITALS — BP 103/62 | HR 67 | Temp 98.1°F | Ht 67.0 in | Wt 153.0 lb

## 2018-05-13 DIAGNOSIS — M545 Low back pain: Secondary | ICD-10-CM | POA: Diagnosis not present

## 2018-05-13 DIAGNOSIS — M7918 Myalgia, other site: Secondary | ICD-10-CM | POA: Diagnosis not present

## 2018-05-13 DIAGNOSIS — G8929 Other chronic pain: Secondary | ICD-10-CM

## 2018-05-13 DIAGNOSIS — R300 Dysuria: Secondary | ICD-10-CM

## 2018-05-13 LAB — MICROSCOPIC EXAMINATION
Bacteria, UA: NONE SEEN
RBC, UA: NONE SEEN /hpf (ref 0–2)
Renal Epithel, UA: NONE SEEN /hpf
WBC, UA: NONE SEEN /hpf (ref 0–5)

## 2018-05-13 LAB — URINALYSIS, COMPLETE
Bilirubin, UA: NEGATIVE
Glucose, UA: NEGATIVE
Ketones, UA: NEGATIVE
Leukocytes, UA: NEGATIVE
Nitrite, UA: NEGATIVE
Protein, UA: NEGATIVE
RBC, UA: NEGATIVE
Specific Gravity, UA: 1.015 (ref 1.005–1.030)
Urobilinogen, Ur: 0.2 mg/dL (ref 0.2–1.0)
pH, UA: 6.5 (ref 5.0–7.5)

## 2018-05-13 MED ORDER — METHYLPREDNISOLONE ACETATE 40 MG/ML IJ SUSP
40.0000 mg | Freq: Once | INTRAMUSCULAR | Status: AC
  Administered 2018-05-13: 40 mg via INTRAMUSCULAR

## 2018-05-13 NOTE — Progress Notes (Signed)
Subjective:    Patient ID: Courtney Allen, female    DOB: February 17, 1966, 53 y.o.   MRN: [id number]  Chief Complaint:  Back Pain   HPI: Courtney Allen is a 53 y.o. female presenting on 05/13/2018 for Back Pain  Pt presents today with complaints of lower back pain. Pt has dementia and her husband is her primary caregiver. He is present with the pt today. Husband states the pt has continued to complain or low mid back pain. He reports this is worse when she is sitting in her chair for ling periods of time. States he has tried some of the stretches, but it is difficult due to her mental state. Husband states she recently had an UTI and wants to make sure this is not the cause of her pain. He has tried Garment/textile technologist Rub with some relief of the symptoms. Pt unable to qualify or quantify the pain.   Relevant past medical, surgical, family, and social history reviewed and updated as indicated.  Allergies and medications reviewed and updated.   Past Medical History:  Diagnosis Date  . Bipolar I disorder, most recent episode (or current) unspecified   . Dementia (HCC)   . 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   . Depression   . Developmental delay   . Fetal alcohol syndrome 01/18/2013  . General learning disability 01/18/2013  . Memory loss   . Mental retardation, moderate (I.Q. 35-49) 01/18/2013  . Retinitis pigmentosa, both eyes 01/18/2013  . Urinary incontinence   . Urinary incontinence due to cognitive impairment 01/18/2013    Past Surgical History:  Procedure Laterality Date  . CESAREAN SECTION    . TONSILLECTOMY    . TUBAL LIGATION      Social History   Socioeconomic History  . Marital status: Married    Spouse name: Courtney Allen  . Number of children: 3  . Years of education: 89  . Highest education level: Not on file  Occupational History  . Occupation:  disabled  Social Needs  . Financial resource strain: Not on file  . Food insecurity:    Worry: Not on file    Inability: Not on file  . Transportation needs:    Medical: Not on file    Non-medical: Not on file  Tobacco Use  . Smoking status: Never Smoker  . Smokeless tobacco: Never Used  Substance and Sexual Activity  . Alcohol use: No  . Drug use: No  . Sexual activity: Not on file  Lifestyle  . Physical activity:    Days per week: Not on file    Minutes per session: Not on file  . Stress: Not on file  Relationships  . Social connections:    Talks on phone: Not on file    Gets together: Not on file    Attends religious service: Not on file    Active member of club or organization: Not on file    Attends meetings of clubs or organizations: Not on file    Relationship status: Not on file  . Intimate partner violence:    Fear of current or ex partner: Not on file    Emotionally abused: Not on file    Physically abused: Not on file    Forced sexual activity: Not on file  Other Topics Concern  . Not on file  Social History Narrative  . Not on file  Outpatient Encounter Medications as of 05/13/2018  Medication Sig  . buPROPion (WELLBUTRIN XL) 150 MG 24 hr tablet Take 1 tablet (150 mg total) by mouth daily.  . cholecalciferol (VITAMIN D) 1000 UNITS tablet Take 1,000 Units by mouth daily.  . hydrocortisone 2.5 % cream Apply topically 2 (two) times daily.  . hydrOXYzine (VISTARIL) 25 MG capsule Take 25 mg by mouth 2 (two) times daily.  . mirtazapine (REMERON) 30 MG tablet Take 1 tablet (30 mg total) by mouth at bedtime.  Marland Kitchen. nystatin ointment (MYCOSTATIN) Apply 1 application topically 2 (two) times daily.  . QUEtiapine (SEROQUEL) 50 MG tablet Take 50 mg by mouth.   . [DISCONTINUED] cephALEXin (KEFLEX) 500 MG capsule Take 1 capsule (500 mg total) by mouth 2 (two) times daily.  . [DISCONTINUED] nitrofurantoin, macrocrystal-monohydrate, (MACROBID) 100 MG capsule Take 1 capsule  (100 mg total) by mouth at bedtime. 1 po BId   Facility-Administered Encounter Medications as of 05/13/2018  Medication  . methylPREDNISolone acetate (DEPO-MEDROL) injection 40 mg    No Known Allergies  Review of Systems  Reason unable to perform ROS: Per husband, pt has dementia.  Constitutional: Negative for appetite change, chills, fatigue and fever.  Respiratory: Negative for shortness of breath.   Genitourinary: Positive for dysuria. Negative for difficulty urinating, frequency and hematuria.  Musculoskeletal: Positive for back pain (lower).  Neurological: Negative for weakness.  Psychiatric/Behavioral: Positive for confusion (at baseline, no increased confusion).  All other systems reviewed and are negative.       Objective:    BP 103/62   Pulse 67   Temp 98.1 F (36.7 C) (Oral)   Ht 5\' 7"  (1.702 m)   Wt 153 lb (69.4 kg)   BMI 23.96 kg/m    Wt Readings from Last 3 Encounters:  05/13/18 153 lb (69.4 kg)  03/28/18 157 lb 9.6 oz (71.5 kg)  02/04/18 156 lb 12.8 oz (71.1 kg)    Physical Exam Vitals signs and nursing note reviewed.  Constitutional:      General: She is not in acute distress.    Appearance: She is normal weight. She is not ill-appearing or toxic-appearing.  HENT:     Mouth/Throat:     Mouth: Mucous membranes are moist.     Pharynx: Oropharynx is clear.  Eyes:     Conjunctiva/sclera: Conjunctivae normal.     Pupils: Pupils are equal, round, and reactive to light.  Neck:     Musculoskeletal: Normal range of motion and neck supple.  Cardiovascular:     Rate and Rhythm: Normal rate and regular rhythm.     Heart sounds: Normal heart sounds. No murmur. No friction rub. No gallop.   Pulmonary:     Effort: Pulmonary effort is normal.     Breath sounds: Normal breath sounds.  Abdominal:     General: Abdomen is flat. Bowel sounds are normal.     Tenderness: There is no abdominal tenderness. There is no right CVA tenderness or left CVA tenderness.    Musculoskeletal:     Thoracic back: Normal.     Lumbar back: She exhibits tenderness and pain. She exhibits normal range of motion, no bony tenderness, no swelling, no edema, no deformity, no laceration, no spasm and normal pulse.     Comments: Left lower paraspinal muscle tenderness. Pt noted to grimace when palpating this area.   Skin:    General: Skin is warm and dry.     Capillary Refill: Capillary refill takes less than 2 seconds.  Neurological:     General: No focal deficit present.     Mental Status: She is alert. Mental status is at baseline.     Sensory: Sensation is intact.     Deep Tendon Reflexes: Reflexes are normal and symmetric.  Psychiatric:        Mood and Affect: Mood normal.        Behavior: Behavior is cooperative.     Results for orders placed or performed in visit on 04/01/18  Urine Culture  Result Value Ref Range   Urine Culture, Routine Final report (A)    Organism ID, Bacteria Proteus mirabilis (A)    ORGANISM ID, BACTERIA Comment    Antimicrobial Susceptibility Comment   Microscopic Examination  Result Value Ref Range   WBC, UA >30 (A) 0 - 5 /hpf   RBC, UA 3-10 (A) 0 - 2 /hpf   Epithelial Cells (non renal) 0-10 0 - 10 /hpf   Renal Epithel, UA 0-10 (A) None seen /hpf   Crystals Present N/A   Crystal Type Triple Phosphate N/A   Mucus, UA Present Not Estab.   Bacteria, UA Many (A) None seen/Few  Urinalysis, Complete  Result Value Ref Range   Specific Gravity, UA 1.010 1.005 - 1.030   pH, UA >8.0 (H) 5.0 - 7.5   Color, UA Yellow Yellow   Appearance Ur Clear Clear   Leukocytes, UA Trace (A) Negative   Protein, UA 1+ (A) Negative/Trace   Glucose, UA Negative Negative   Ketones, UA Negative Negative   RBC, UA Negative Negative   Bilirubin, UA Negative Negative   Urobilinogen, Ur 0.2 0.2 - 1.0 mg/dL   Nitrite, UA Negative Negative   Microscopic Examination See below:      Urinalysis in office: unremarkable  Pertinent labs & imaging results that  were available during my care of the patient were reviewed by me and considered in my medical decision making.  Assessment & Plan:  Diagnoses and all orders for this visit:  Dysuria Negative urinalysis in office.  -     Urinalysis, Complete -     Urine Culture  Chronic midline low back pain without sciatica Back strengthening and stretching exercises discussed. Can try BioFreeze or Thermacare topicals. Tylenol as needed for pain. If this persists or worsens, may consider PT. Report any new or worsening symptoms.   Lumbar muscle pain Back strengthening and stretching exercises discussed. Can try BioFreeze or Thermacare topicals. Tylenol as needed for pain. If this persists or worsens, may consider PT. Report any new or worsening symptoms. -     methylPREDNISolone acetate (DEPO-MEDROL) injection 40 mg    Continue all other maintenance medications.  Follow up plan: Return in about 4 weeks (around 06/10/2018), or if symptoms worsen or fail to improve.  Educational handout given for chronic back pain  The above assessment and management plan was discussed with the patient. The patient verbalized understanding of and has agreed to the management plan. Patient is aware to call the clinic if symptoms persist or worsen. Patient is aware when to return to the clinic for a follow-up visit. Patient educated on when it is appropriate to go to the emergency department.   Kari BaarsMichelle Zenith Lamphier, FNP-C [facility] [phone]

## 2018-05-13 NOTE — Patient Instructions (Signed)

## 2018-05-14 LAB — URINE CULTURE

## 2018-07-01 ENCOUNTER — Ambulatory Visit (INDEPENDENT_AMBULATORY_CARE_PROVIDER_SITE_OTHER): Payer: BLUE CROSS/BLUE SHIELD | Admitting: Family Medicine

## 2018-07-01 ENCOUNTER — Encounter: Payer: Self-pay | Admitting: Family Medicine

## 2018-07-01 ENCOUNTER — Ambulatory Visit (INDEPENDENT_AMBULATORY_CARE_PROVIDER_SITE_OTHER): Payer: BLUE CROSS/BLUE SHIELD

## 2018-07-01 VITALS — BP 102/68 | HR 74 | Temp 98.0°F | Ht 67.0 in | Wt 156.0 lb

## 2018-07-01 DIAGNOSIS — Z780 Asymptomatic menopausal state: Secondary | ICD-10-CM | POA: Insufficient documentation

## 2018-07-01 DIAGNOSIS — M85852 Other specified disorders of bone density and structure, left thigh: Secondary | ICD-10-CM | POA: Diagnosis not present

## 2018-07-01 DIAGNOSIS — Z1211 Encounter for screening for malignant neoplasm of colon: Secondary | ICD-10-CM | POA: Diagnosis not present

## 2018-07-01 DIAGNOSIS — Z1231 Encounter for screening mammogram for malignant neoplasm of breast: Secondary | ICD-10-CM

## 2018-07-01 DIAGNOSIS — Z124 Encounter for screening for malignant neoplasm of cervix: Secondary | ICD-10-CM

## 2018-07-01 DIAGNOSIS — Z01419 Encounter for gynecological examination (general) (routine) without abnormal findings: Secondary | ICD-10-CM | POA: Diagnosis not present

## 2018-07-01 NOTE — Patient Instructions (Signed)

## 2018-07-01 NOTE — Progress Notes (Signed)
Subjective:  Patient ID: Courtney Allen, female    DOB: [birth year], 53 y.o.   MRN: [id number]  Chief Complaint:  Annual Exam   HPI: Courtney Allen is a 53 y.o. female presenting on 07/01/2018 for Annual Exam   1. Well woman exam with routine gynecological exam   Pt presents today for her annual physical exam. Pt has dementia and is cared for by her husband. Pt is at baseline with no declines per husband. Husband has no concerns. Pt denies pain or complaints when asked. Pt will answer some yes no questions. Husband states she has been active and is eating well. No sleep disturbances per husband.   Relevant past medical, surgical, family, and social history reviewed and updated as indicated.  Allergies and medications reviewed and updated.   Past Medical History:  Diagnosis Date  . Bipolar I disorder, most recent episode (or current) unspecified   . Dementia ([place])   . 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   . Depression   . Developmental delay   . Fetal alcohol syndrome 01/18/2013  . General learning disability 01/18/2013  . Memory loss   . Mental retardation, moderate (I.Q. 35-49) 01/18/2013  . Retinitis pigmentosa, both eyes 01/18/2013  . Urinary incontinence   . Urinary incontinence due to cognitive impairment 01/18/2013    Past Surgical History:  Procedure Laterality Date  . CESAREAN SECTION    . TONSILLECTOMY    . TUBAL LIGATION      Social History   Socioeconomic History  . Marital status: Married    Spouse name: Jenny Reichmann  . Number of children: 3  . Years of education: 69  . Highest education level: Not on file  Occupational History  . Occupation: disabled  Social Needs  . Financial resource strain: Not on file  . Food insecurity:    Worry: Not on file    Inability: Not on file  . Transportation needs:    Medical: Not on file   Non-medical: Not on file  Tobacco Use  . Smoking status: Never Smoker  . Smokeless tobacco: Never Used  Substance and Sexual Activity  . Alcohol use: No  . Drug use: No  . Sexual activity: Not on file  Lifestyle  . Physical activity:    Days per week: Not on file    Minutes per session: Not on file  . Stress: Not on file  Relationships  . Social connections:    Talks on phone: Not on file    Gets together: Not on file    Attends religious service: Not on file    Active member of club or organization: Not on file    Attends meetings of clubs or organizations: Not on file    Relationship status: Not on file  . Intimate partner violence:    Fear of current or ex partner: Not on file    Emotionally abused: Not on file    Physically abused: Not on file    Forced sexual activity: Not on file  Other Topics Concern  . Not on file  Social History Narrative  . Not on file    Outpatient Encounter Medications as of 07/01/2018  Medication Sig  . buPROPion (WELLBUTRIN XL) 150 MG 24 hr tablet Take 1 tablet (150 mg total) by mouth daily.  . cholecalciferol (VITAMIN D) 1000 UNITS tablet Take 1,000 Units by mouth daily.  . hydrocortisone  2.5 % cream Apply topically 2 (two) times daily.  . hydrOXYzine (VISTARIL) 25 MG capsule Take 25 mg by mouth 2 (two) times daily.  . mirtazapine (REMERON) 30 MG tablet Take 1 tablet (30 mg total) by mouth at bedtime.  Marland Kitchen nystatin ointment (MYCOSTATIN) Apply 1 application topically 2 (two) times daily.  . QUEtiapine (SEROQUEL) 50 MG tablet Take 50 mg by mouth.    No facility-administered encounter medications on file as of 07/01/2018.     No Known Allergies  Review of Systems  Unable to perform ROS: Dementia (ROS per husband)  Constitutional: Negative for chills, fatigue, fever and unexpected weight change.  HENT: Negative for congestion.   Respiratory: Negative for cough and shortness of breath.   Cardiovascular: Negative for chest pain, palpitations  and leg swelling.  Gastrointestinal: Negative for anal bleeding, blood in stool, constipation, diarrhea and vomiting.  Genitourinary: Negative for frequency, vaginal bleeding, vaginal discharge and vaginal pain.  Neurological: Negative for weakness.  Psychiatric/Behavioral: Positive for confusion (baseline).  All other systems reviewed and are negative.       Objective:  BP 102/68   Pulse 74   Temp 98 F (36.7 C) (Oral)   Ht _0  (1.702 m)   Wt 156 lb (70.8 kg)   BMI 24.43 kg/m    Wt Readings from Last 3 Encounters:  07/01/18 156 lb (70.8 kg)  05/13/18 153 lb (69.4 kg)  03/28/18 157 lb 9.6 oz (71.5 kg)    Physical Exam Vitals signs and nursing note reviewed. Exam conducted with a chaperone present.  Constitutional:      Appearance: Normal appearance. She is well-developed and well-groomed.  HENT:     Head: Normocephalic and atraumatic.     Jaw: There is normal jaw occlusion.     Right Ear: Hearing, tympanic membrane, ear canal and external ear normal.     Left Ear: Hearing, tympanic membrane, ear canal and external ear normal.     Nose: Nose normal.     Mouth/Throat:     Lips: Pink.     Mouth: Mucous membranes are moist.     Pharynx: Oropharynx is clear.  Eyes:     General: Lids are normal.     Extraocular Movements: Extraocular movements intact.     Conjunctiva/sclera: Conjunctivae normal.     Pupils: Pupils are equal, round, and reactive to light.  Neck:     Musculoskeletal: Full passive range of motion without pain and neck supple.     Thyroid: No thyroid mass, thyromegaly or thyroid tenderness.     Trachea: Trachea and phonation normal.  Cardiovascular:     Rate and Rhythm: Normal rate and regular rhythm.     Chest Wall: PMI is not displaced.     Pulses: Normal pulses.     Heart sounds: Normal heart sounds. No murmur. No friction rub. No gallop.   Pulmonary:     Effort: Pulmonary effort is normal. No respiratory distress.     Breath sounds: Normal breath  sounds.  Chest:     Breasts:        Right: Normal.        Left: Normal.  Abdominal:     General: Abdomen is protuberant. Bowel sounds are normal.     Palpations: Abdomen is soft.     Tenderness: There is no abdominal tenderness.     Hernia: There is no hernia in the right inguinal area or left inguinal area.  Genitourinary:    Exam position: Lithotomy  position.     Pubic Area: No rash or pubic lice.      Labia:        Right: No rash, tenderness, lesion or injury.        Left: No rash, tenderness, lesion or injury.      Urethra: No prolapse, urethral pain, urethral swelling or urethral lesion.  Musculoskeletal: Normal range of motion.  Lymphadenopathy:     Cervical: No cervical adenopathy.     Upper Body:     Right upper body: No supraclavicular, axillary or pectoral adenopathy.     Left upper body: No supraclavicular, axillary or pectoral adenopathy.     Lower Body: No right inguinal adenopathy. No left inguinal adenopathy.  Skin:    General: Skin is warm and dry.     Capillary Refill: Capillary refill takes less than 2 seconds.  Neurological:     General: No focal deficit present.     Mental Status: She is alert. Mental status is at baseline.     Motor: Motor function is intact.  Psychiatric:        Mood and Affect: Mood normal.        Behavior: Behavior normal.     Results for orders placed or performed in visit on 05/13/18  Urine Culture  Result Value Ref Range   Urine Culture, Routine Final report    Organism ID, Bacteria Comment   Microscopic Examination  Result Value Ref Range   WBC, UA None seen 0 - 5 /hpf   RBC, UA None seen 0 - 2 /hpf   Epithelial Cells (non renal) 0-10 0 - 10 /hpf   Renal Epithel, UA None seen None seen /hpf   Bacteria, UA None seen None seen/Few  Urinalysis, Complete  Result Value Ref Range   Specific Gravity, UA 1.015 1.005 - 1.030   pH, UA 6.5 5.0 - 7.5   Color, UA Yellow Yellow   Appearance Ur Clear Clear   Leukocytes, UA Negative  Negative   Protein, UA Negative Negative/Trace   Glucose, UA Negative Negative   Ketones, UA Negative Negative   RBC, UA Negative Negative   Bilirubin, UA Negative Negative   Urobilinogen, Ur 0.2 0.2 - 1.0 mg/dL   Nitrite, UA Negative Negative   Microscopic Examination See below:        Pertinent labs & imaging results that were available during my care of the patient were reviewed by me and considered in my medical decision making.  Assessment & Plan:  Atlas was seen today for annual exam.  Diagnoses and all orders for this visit:  Well woman exam with routine gynecological exam Diet and exercise encouraged. Health maintenance discussed. Labs pending.  -     CMP14+EGFR -     CBC with Differential/Platelet -     TSH -     Lipid panel -     IGP, Aptima HPV, rfx 16/18,45 -     Cologuard -     DG [facility] DEXA -     MM 3D SCREEN BREAST BILATERAL; Future  Visit for screening mammogram -     MM 3D SCREEN BREAST BILATERAL; Future  Colon cancer screening -     Cologuard  Cervical cancer screening -     IGP, Aptima HPV, rfx 16/18,45  Postmenopausal -     DG [facility] DEXA     Continue all other maintenance medications.  Follow up plan: Return in 3 months (on 09/29/2018).  Educational handout given for  health maintenance   The above assessment and management plan was discussed with the patient. The patient verbalized understanding of and has agreed to the management plan. Patient is aware to call the clinic if symptoms persist or worsen. Patient is aware when to return to the clinic for a follow-up visit. Patient educated on when it is appropriate to go to the emergency department.   Monia Pouch, FNP-C [facility] [phone]

## 2018-07-02 LAB — CMP14+EGFR
ALT: 14 IU/L (ref 0–32)
AST: 14 IU/L (ref 0–40)
Albumin/Globulin Ratio: 1.9 (ref 1.2–2.2)
Albumin: 4.4 g/dL (ref 3.8–4.9)
Alkaline Phosphatase: 77 IU/L (ref 39–117)
BUN/Creatinine Ratio: 15 (ref 9–23)
BUN: 13 mg/dL (ref 6–24)
Bilirubin Total: 0.4 mg/dL (ref 0.0–1.2)
CO2: 23 mmol/L (ref 20–29)
Calcium: 9.4 mg/dL (ref 8.7–10.2)
Chloride: 104 mmol/L (ref 96–106)
Creatinine, Ser: 0.85 mg/dL (ref 0.57–1.00)
GFR calc Af Amer: 90 mL/min/{1.73_m2} (ref >59)
GFR calc non Af Amer: 78 mL/min/{1.73_m2} (ref >59)
Globulin, Total: 2.3 g/dL (ref 1.5–4.5)
Glucose: 78 mg/dL (ref 65–99)
Potassium: 4 mmol/L (ref 3.5–5.2)
Sodium: 140 mmol/L (ref 134–144)
Total Protein: 6.7 g/dL (ref 6.0–8.5)

## 2018-07-02 LAB — CBC WITH DIFFERENTIAL/PLATELET
Basophils Absolute: 0.1 10*3/uL (ref 0.0–0.2)
Basos: 1 %
EOS (ABSOLUTE): 0.1 10*3/uL (ref 0.0–0.4)
Eos: 1 %
Hematocrit: 38 % (ref 34.0–46.6)
Hemoglobin: 13 g/dL (ref 11.1–15.9)
Immature Grans (Abs): 0 10*3/uL (ref 0.0–0.1)
Immature Granulocytes: 0 %
Lymphocytes Absolute: 1.8 10*3/uL (ref 0.7–3.1)
Lymphs: 36 %
MCH: 31.9 pg (ref 26.6–33.0)
MCHC: 34.2 g/dL (ref 31.5–35.7)
MCV: 93 fL (ref 79–97)
Monocytes Absolute: 0.4 10*3/uL (ref 0.1–0.9)
Monocytes: 7 %
Neutrophils Absolute: 2.7 10*3/uL (ref 1.4–7.0)
Neutrophils: 55 %
Platelets: 192 10*3/uL (ref 150–450)
RBC: 4.08 x10E6/uL (ref 3.77–5.28)
RDW: 12.1 % (ref 11.7–15.4)
WBC: 5 10*3/uL (ref 3.4–10.8)

## 2018-07-02 LAB — LIPID PANEL
Chol/HDL Ratio: 3.2 ratio (ref 0.0–4.4)
Cholesterol, Total: 184 mg/dL (ref 100–199)
HDL: 57 mg/dL (ref >39)
LDL Calculated: 110 mg/dL — ABNORMAL HIGH (ref 0–99)
Triglycerides: 86 mg/dL (ref 0–149)
VLDL Cholesterol Cal: 17 mg/dL (ref 5–40)

## 2018-07-02 LAB — TSH: TSH: 2.03 u[IU]/mL (ref 0.450–4.500)

## 2018-07-06 LAB — IGP, APTIMA HPV, RFX 16/18,45: HPV Aptima: NEGATIVE

## 2018-07-09 ENCOUNTER — Telehealth: Payer: Self-pay | Admitting: Family Medicine

## 2018-07-11 NOTE — Telephone Encounter (Signed)
Printed labs and put in mail °

## 2018-07-15 DIAGNOSIS — G3109 Other frontotemporal dementia: Secondary | ICD-10-CM | POA: Diagnosis not present

## 2018-07-15 DIAGNOSIS — F411 Generalized anxiety disorder: Secondary | ICD-10-CM | POA: Diagnosis not present

## 2018-10-04 ENCOUNTER — Other Ambulatory Visit: Payer: Self-pay | Admitting: Family Medicine

## 2018-10-04 MED ORDER — NITROFURANTOIN MONOHYD MACRO 100 MG PO CAPS: 100.0000 mg | ORAL_CAPSULE | Freq: Every day | ORAL | 1 refills | Status: DC

## 2018-10-04 NOTE — Telephone Encounter (Signed)
Takes for prevention - can they have a refill - originally written in 06/2018.

## 2018-10-06 ENCOUNTER — Other Ambulatory Visit: Payer: Self-pay

## 2018-10-07 ENCOUNTER — Ambulatory Visit: Payer: BLUE CROSS/BLUE SHIELD | Admitting: Family Medicine

## 2018-10-07 ENCOUNTER — Encounter (INDEPENDENT_AMBULATORY_CARE_PROVIDER_SITE_OTHER): Payer: Self-pay

## 2018-10-07 ENCOUNTER — Telehealth: Payer: Self-pay | Admitting: Family Medicine

## 2018-10-07 ENCOUNTER — Encounter: Payer: Self-pay | Admitting: Family Medicine

## 2018-10-07 VITALS — BP 105/59 | HR 70 | Temp 97.8°F | Ht 67.0 in | Wt 164.0 lb

## 2018-10-07 DIAGNOSIS — M545 Low back pain: Secondary | ICD-10-CM | POA: Diagnosis not present

## 2018-10-07 DIAGNOSIS — N390 Urinary tract infection, site not specified: Secondary | ICD-10-CM | POA: Diagnosis not present

## 2018-10-07 DIAGNOSIS — G8929 Other chronic pain: Secondary | ICD-10-CM

## 2018-10-07 DIAGNOSIS — F0281 Dementia in other diseases classified elsewhere with behavioral disturbance: Secondary | ICD-10-CM

## 2018-10-07 DIAGNOSIS — F02818 Dementia in other diseases classified elsewhere, unspecified severity, with other behavioral disturbance: Secondary | ICD-10-CM

## 2018-10-07 MED ORDER — QUETIAPINE FUMARATE 50 MG PO TABS: 100.0000 mg | ORAL_TABLET | Freq: Every day | ORAL | 3 refills | Status: DC

## 2018-10-07 MED ORDER — NITROFURANTOIN MONOHYD MACRO 100 MG PO CAPS: 100.0000 mg | ORAL_CAPSULE | Freq: Every day | ORAL | 1 refills | Status: DC

## 2018-10-07 MED ORDER — HYDROXYZINE PAMOATE 25 MG PO CAPS: 25.0000 mg | ORAL_CAPSULE | Freq: Two times a day (BID) | ORAL | 3 refills | Status: DC

## 2018-10-07 MED ORDER — BUPROPION HCL ER (XL) 150 MG PO TB24: 150.0000 mg | ORAL_TABLET | Freq: Every day | ORAL | 3 refills | Status: DC

## 2018-10-07 MED ORDER — MIRTAZAPINE 30 MG PO TABS: 30.0000 mg | ORAL_TABLET | Freq: Every day | ORAL | 1 refills | Status: AC

## 2018-10-07 MED ORDER — QUETIAPINE FUMARATE 50 MG PO TABS: 50.0000 mg | ORAL_TABLET | Freq: Every day | ORAL | 3 refills | Status: DC

## 2018-10-07 NOTE — Progress Notes (Signed)
Subjective:  Patient ID: Courtney Allen, female    DOB: 30-Jan-1966, 53 y.o.   MRN: [id number]  Chief Complaint:  Medical Management of Chronic Issues   HPI: Courtney Allen is a 53 y.o. female presenting on 10/07/2018 for Medical Management of Chronic Issues   1. Dementia due to medical condition with behavioral disturbance [facility])  Husband is primary care giver and is present with the pt today. Husband states pt is doing well on current medications. He states she is tolerating the medications without associated side effects.    2. Frequent UTI  On preventative Macrobid. No complaints of dysuria, frequency, or hematuria. No changes in color of urine. No abdominal pain.   3. Chronic midline low back pain without sciatica  Ongoing. Positional changes help with the pain. Has been doing stretching exercises at home. No increase in symptoms.      Relevant past medical, surgical, family, and social history reviewed and updated as indicated.  Allergies and medications reviewed and updated.   Past Medical History:  Diagnosis Date  . Bipolar I disorder, most recent episode (or current) unspecified   . Dementia ([place])   . 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   . Depression   . Developmental delay   . Fetal alcohol syndrome 01/18/2013  . General learning disability 01/18/2013  . Memory loss   . Mental retardation, moderate (I.Q. 35-49) 01/18/2013  . Retinitis pigmentosa, both eyes 01/18/2013  . Urinary incontinence   . Urinary incontinence due to cognitive impairment 01/18/2013    Past Surgical History:  Procedure Laterality Date  . CESAREAN SECTION    . TONSILLECTOMY    . TUBAL LIGATION      Social History   Socioeconomic History  . Marital status: Married    Spouse name: Jenny Reichmann  . Number of children: 3  . Years of education: 8  . Highest  education level: Not on file  Occupational History  . Occupation: disabled  Social Needs  . Financial resource strain: Not on file  . Food insecurity:    Worry: Not on file    Inability: Not on file  . Transportation needs:    Medical: Not on file    Non-medical: Not on file  Tobacco Use  . Smoking status: Never Smoker  . Smokeless tobacco: Never Used  Substance and Sexual Activity  . Alcohol use: No  . Drug use: No  . Sexual activity: Not on file  Lifestyle  . Physical activity:    Days per week: Not on file    Minutes per session: Not on file  . Stress: Not on file  Relationships  . Social connections:    Talks on phone: Not on file    Gets together: Not on file    Attends religious service: Not on file    Active member of club or organization: Not on file    Attends meetings of clubs or organizations: Not on file    Relationship status: Not on file  . Intimate partner violence:    Fear of current or ex partner: Not on file    Emotionally abused: Not on file    Physically abused: Not on file    Forced sexual activity: Not on file  Other Topics Concern  . Not on file  Social History Narrative  . Not on file    Outpatient Encounter Medications as of 10/07/2018  Medication Sig  . buPROPion (WELLBUTRIN XL) 150 MG 24 hr tablet Take 1 tablet (150 mg total) by mouth daily.  . cholecalciferol (VITAMIN D) 1000 UNITS tablet Take 1,000 Units by mouth daily.  . hydrOXYzine (VISTARIL) 25 MG capsule Take 1 capsule (25 mg total) by mouth 2 (two) times daily.  . mirtazapine (REMERON) 30 MG tablet Take 1 tablet (30 mg total) by mouth at bedtime.  . nitrofurantoin, macrocrystal-monohydrate, (MACROBID) 100 MG capsule Take 1 capsule (100 mg total) by mouth daily.  . QUEtiapine (SEROQUEL) 50 MG tablet Take 2 tablets (100 mg total) by mouth at bedtime for 30 days.  . [DISCONTINUED] buPROPion (WELLBUTRIN XL) 150 MG 24 hr tablet Take 1 tablet (150 mg total) by mouth daily.  .  [DISCONTINUED] hydrOXYzine (VISTARIL) 25 MG capsule Take 25 mg by mouth 2 (two) times daily.  . [DISCONTINUED] mirtazapine (REMERON) 30 MG tablet Take 1 tablet (30 mg total) by mouth at bedtime.  . [DISCONTINUED] nitrofurantoin, macrocrystal-monohydrate, (MACROBID) 100 MG capsule Take 1 capsule (100 mg total) by mouth daily.  . [DISCONTINUED] QUEtiapine (SEROQUEL) 50 MG tablet Take 50 mg by mouth.   . hydrocortisone 2.5 % cream Apply topically 2 (two) times daily. (Patient not taking: Reported on 10/07/2018)  . nystatin ointment (MYCOSTATIN) Apply 1 application topically 2 (two) times daily. (Patient not taking: Reported on 10/07/2018)  . QUEtiapine (SEROQUEL) 50 MG tablet Take 1 tablet (50 mg total) by mouth daily for 30 days.   No facility-administered encounter medications on file as of 10/07/2018.     No Known Allergies  Review of Systems  Unable to perform ROS: Dementia (ROS by husband)  Constitutional: Negative for activity change, appetite change, fatigue, fever and unexpected weight change.  Respiratory: Negative for cough, chest tightness, shortness of breath and wheezing.   Cardiovascular: Negative for chest pain, palpitations and leg swelling.  Gastrointestinal: Negative for abdominal pain, diarrhea, nausea and vomiting.  Genitourinary: Negative for decreased urine volume, difficulty urinating, dysuria, frequency, hematuria and urgency.  Musculoskeletal: Positive for back pain.  Neurological: Negative for dizziness, weakness, light-headedness and headaches.  Psychiatric/Behavioral: Positive for agitation, behavioral problems, confusion (baseline) and sleep disturbance.  All other systems reviewed and are negative.       Objective:  BP (!) 105/59   Pulse 70   Temp 97.8 F (36.6 C) (Oral)   Ht _0  (1.702 m)   Wt 164 lb (74.4 kg)   BMI 25.69 kg/m    Wt Readings from Last 3 Encounters:  10/07/18 164 lb (74.4 kg)  07/01/18 156 lb (70.8 kg)  05/13/18 153 lb (69.4 kg)     Physical Exam Vitals signs and nursing note reviewed.  Constitutional:      General: She is not in acute distress.    Appearance: Normal appearance. She is well-developed and well-groomed. She is not ill-appearing, toxic-appearing or diaphoretic.  HENT:     Head: Normocephalic and atraumatic.     Jaw: There is normal jaw occlusion.     Right Ear: Hearing normal.     Left Ear: Hearing normal.     Nose: Nose normal.     Mouth/Throat:     Lips: Pink.     Mouth: Mucous membranes are moist.     Pharynx: Oropharynx is clear. Uvula midline.  Eyes:     General: Lids are normal.     Extraocular Movements: Extraocular movements intact.     Conjunctiva/sclera: Conjunctivae normal.     Pupils: Pupils are equal, round,  and reactive to light.  Neck:     Musculoskeletal: Normal range of motion and neck supple.     Thyroid: No thyroid mass, thyromegaly or thyroid tenderness.     Vascular: No carotid bruit or JVD.     Trachea: Trachea and phonation normal.  Cardiovascular:     Rate and Rhythm: Normal rate and regular rhythm.     Chest Wall: PMI is not displaced.     Pulses: Normal pulses.     Heart sounds: Normal heart sounds. No murmur. No friction rub. No gallop.   Pulmonary:     Effort: Pulmonary effort is normal. No respiratory distress.     Breath sounds: Normal breath sounds. No wheezing.  Abdominal:     General: Bowel sounds are normal. There is no distension or abdominal bruit.     Palpations: Abdomen is soft. There is no hepatomegaly or splenomegaly.     Tenderness: There is no abdominal tenderness. There is no right CVA tenderness or left CVA tenderness.     Hernia: No hernia is present.  Musculoskeletal: Normal range of motion.     Right lower leg: No edema.     Left lower leg: No edema.  Lymphadenopathy:     Cervical: No cervical adenopathy.  Skin:    General: Skin is warm and dry.     Capillary Refill: Capillary refill takes less than 2 seconds.     Coloration: Skin is not  cyanotic, jaundiced or pale.     Findings: No rash.  Neurological:     General: No focal deficit present.     Mental Status: She is alert. Mental status is at baseline.     Cranial Nerves: Cranial nerves are intact.     Sensory: Sensation is intact.     Motor: Motor function is intact.     Coordination: Coordination is intact.     Gait: Gait is intact.     Deep Tendon Reflexes: Reflexes are normal and symmetric.  Psychiatric:        Mood and Affect: Mood and affect normal.        Behavior: Behavior is cooperative.        Cognition and Memory: Cognition is impaired. Memory is impaired.     Results for orders placed or performed in visit on 07/01/18  CMP14+EGFR  Result Value Ref Range   Glucose 78 65 - 99 mg/dL   BUN 13 6 - 24 mg/dL   Creatinine, Ser 0.85 0.57 - 1.00 mg/dL   GFR calc non Af Amer 78 >59 mL/min/1.73   GFR calc Af Amer 90 >59 mL/min/1.73   BUN/Creatinine Ratio 15 9 - 23   Sodium 140 134 - 144 mmol/L   Potassium 4.0 3.5 - 5.2 mmol/L   Chloride 104 96 - 106 mmol/L   CO2 23 20 - 29 mmol/L   Calcium 9.4 8.7 - 10.2 mg/dL   Total Protein 6.7 6.0 - 8.5 g/dL   Albumin 4.4 3.8 - 4.9 g/dL   Globulin, Total 2.3 1.5 - 4.5 g/dL   Albumin/Globulin Ratio 1.9 1.2 - 2.2   Bilirubin Total 0.4 0.0 - 1.2 mg/dL   Alkaline Phosphatase 77 39 - 117 IU/L   AST 14 0 - 40 IU/L   ALT 14 0 - 32 IU/L  CBC with Differential/Platelet  Result Value Ref Range   WBC 5.0 3.4 - 10.8 x10E3/uL   RBC 4.08 3.77 - 5.28 x10E6/uL   Hemoglobin 13.0 11.1 - 15.9 g/dL  Hematocrit 38.0 34.0 - 46.6 %   MCV 93 79 - 97 fL   MCH 31.9 26.6 - 33.0 pg   MCHC 34.2 31.5 - 35.7 g/dL   RDW 12.1 11.7 - 15.4 %   Platelets 192 150 - 450 x10E3/uL   Neutrophils 55 Not Estab. %   Lymphs 36 Not Estab. %   Monocytes 7 Not Estab. %   Eos 1 Not Estab. %   Basos 1 Not Estab. %   Neutrophils Absolute 2.7 1.4 - 7.0 x10E3/uL   Lymphocytes Absolute 1.8 0.7 - 3.1 x10E3/uL   Monocytes Absolute 0.4 0.1 - 0.9 x10E3/uL    EOS (ABSOLUTE) 0.1 0.0 - 0.4 x10E3/uL   Basophils Absolute 0.1 0.0 - 0.2 x10E3/uL   Immature Granulocytes 0 Not Estab. %   Immature Grans (Abs) 0.0 0.0 - 0.1 x10E3/uL  TSH  Result Value Ref Range   TSH 2.030 0.450 - 4.500 uIU/mL  Lipid panel  Result Value Ref Range   Cholesterol, Total 184 100 - 199 mg/dL   Triglycerides 86 0 - 149 mg/dL   HDL 57 >39 mg/dL   VLDL Cholesterol Cal 17 5 - 40 mg/dL   LDL Calculated 110 (H) 0 - 99 mg/dL   Chol/HDL Ratio 3.2 0.0 - 4.4 ratio  IGP, Aptima HPV, rfx 16/18,45  Result Value Ref Range   Interpretation NILM    Category NIL    Adequacy ENDO    Clinician Provided ICD10 Comment    Performed by: Comment    Note: Comment    Test Methodology Comment    HPV Aptima Negative Negative       Pertinent labs & imaging results that were available during my care of the patient were reviewed by me and considered in my medical decision making.  Assessment & Plan:  Chudney was seen today for medical management of chronic issues.  Diagnoses and all orders for this visit:  Dementia due to medical condition with behavioral disturbance [facility]) Doing well on current medications. Will continue below.  -     buPROPion (WELLBUTRIN XL) 150 MG 24 hr tablet; Take 1 tablet (150 mg total) by mouth daily. -     mirtazapine (REMERON) 30 MG tablet; Take 1 tablet (30 mg total) by mouth at bedtime. -     QUEtiapine (SEROQUEL) 50 MG tablet; Take 2 tablets (100 mg total) by mouth at bedtime for 30 days. -     hydrOXYzine (VISTARIL) 25 MG capsule; Take 1 capsule (25 mg total) by mouth 2 (two) times daily. -     QUEtiapine (SEROQUEL) 50 MG tablet; Take 1 tablet (50 mg total) by mouth daily for 30 days.  Frequent UTI Has been on  Preventative Macrobid, will continue.  -     nitrofurantoin, macrocrystal-monohydrate, (MACROBID) 100 MG capsule; Take 1 capsule (100 mg total) by mouth daily.  Chronic midline low back pain without sciatica Symptomatic care discussed. Report any new  or worsening symptoms.     Continue all other maintenance medications.  Follow up plan: Return in about 3 months (around 01/07/2019), or if symptoms worsen or fail to improve.   The above assessment and management plan was discussed with the patient. The patient verbalized understanding of and has agreed to the management plan. Patient is aware to call the clinic if symptoms persist or worsen. Patient is aware when to return to the clinic for a follow-up visit. Patient educated on when it is appropriate to go to the emergency department.  Monia Pouch, FNP-C [facility] [phone]

## 2018-10-17 ENCOUNTER — Other Ambulatory Visit: Payer: Self-pay | Admitting: Family Medicine

## 2018-10-17 DIAGNOSIS — F0281 Dementia in other diseases classified elsewhere with behavioral disturbance: Secondary | ICD-10-CM

## 2018-10-17 DIAGNOSIS — F02818 Dementia in other diseases classified elsewhere, unspecified severity, with other behavioral disturbance: Secondary | ICD-10-CM

## 2018-12-02 DIAGNOSIS — F0281 Dementia in other diseases classified elsewhere with behavioral disturbance: Secondary | ICD-10-CM | POA: Diagnosis not present

## 2018-12-02 DIAGNOSIS — F79 Unspecified intellectual disabilities: Secondary | ICD-10-CM | POA: Diagnosis not present

## 2018-12-02 DIAGNOSIS — F411 Generalized anxiety disorder: Secondary | ICD-10-CM | POA: Diagnosis not present

## 2019-01-06 ENCOUNTER — Ambulatory Visit: Payer: BLUE CROSS/BLUE SHIELD | Admitting: Family Medicine

## 2019-01-06 ENCOUNTER — Encounter: Payer: Self-pay | Admitting: Family Medicine

## 2019-01-06 VITALS — BP 99/71 | HR 60 | Temp 97.5°F | Ht 67.0 in | Wt 169.0 lb

## 2019-01-06 DIAGNOSIS — Z23 Encounter for immunization: Secondary | ICD-10-CM | POA: Diagnosis not present

## 2019-01-06 DIAGNOSIS — B372 Candidiasis of skin and nail: Secondary | ICD-10-CM | POA: Diagnosis not present

## 2019-01-06 DIAGNOSIS — F0281 Dementia in other diseases classified elsewhere with behavioral disturbance: Secondary | ICD-10-CM | POA: Diagnosis not present

## 2019-01-06 DIAGNOSIS — L249 Irritant contact dermatitis, unspecified cause: Secondary | ICD-10-CM | POA: Insufficient documentation

## 2019-01-06 DIAGNOSIS — Z79899 Other long term (current) drug therapy: Secondary | ICD-10-CM

## 2019-01-06 DIAGNOSIS — F02818 Dementia in other diseases classified elsewhere, unspecified severity, with other behavioral disturbance: Secondary | ICD-10-CM

## 2019-01-06 MED ORDER — NYSTATIN 100000 UNIT/GM EX OINT: 1.0000 "application " | TOPICAL_OINTMENT | Freq: Two times a day (BID) | CUTANEOUS | 1 refills | Status: DC

## 2019-01-06 MED ORDER — HYDROCORTISONE 2.5 % EX CREA: TOPICAL_CREAM | Freq: Two times a day (BID) | CUTANEOUS | 0 refills | Status: DC

## 2019-01-06 NOTE — Patient Instructions (Signed)
The 4 Principles of Healthy Living  1. Do Not Smoke.  2. Maintain a BMI<30.  3. Exercise 150 minutes/week. (30 minutes 5 days a week of some form of aerobic exercise)  4. Eat 5 servings of fruits or vegetables daily.   Several studies have conclusively shown that individuals who do these things have a dramatic reduction in overall mortality, heart disease, diabetes, hypertension, stroke, congestive heart failure, and cancer. This means that without taking any pills, vitamins, tonics, etc - without spending a single penny - you can live a longer, healthier, more productive life. Let's look at these 4 principles separately.   1. Do Not Smoke - Make up your mind to quit, talk with your doctor to formulate a plan, and set a date.  2. Get to and maintain a BMI<30. This gets you out of the obese category. No longer being obese will dramatically reduce you and your family's risk of a multitude of health problems. It is not easy, but, it is possible. If you are extremely obese it may take years, but, each step you take will lead to dramatic rewards. With just 20 pounds of weight loss most people feel better, have less fatigue and joint pain, and feel more energetic. If you have health problems like diabetes, hypertension, or high cholesterol you might do away with your need for some medications. And most of all, while you change you lifestyle to attain this goal you will set a good example for all those around you, especially your children. Here are two proven steps to help you start losing weight. ? Portion Control - Eating your meals on a smaller plate and limiting the amount of calories you eat at each meal has been shown to lead to weight loss. The average plate is 10 inches in diameter. A 10 inch plate piled high with food can add up to 1500 calories (even more if you go back for seconds). The typical man needs 2000 calories per day total. Try using a smaller plate (8 inch paper plate or 7 inch saucer)  at each meal and NEVER go back for second helpings. ? Pedometer - individuals who wear a pedometer and try to walk 10,000 steps each day increase their physical activity, lose weight, and decrease their blood pressures.  3. Exercise 150 minutes/week. The overall health benefits of regular aerobic exercise are overwhelming:  Reduces the risk of dying prematurely. Reduces the risk of dying from heart disease.  Reduces the risk of stroke.  Reduces the risk of developing diabetes.  Reduces the risk of developing high blood pressure.  Helps reduce blood pressure in people who already have high blood pressure.  Reduces the risk of developing colon cancer.  Reduces feelings of depression and anxiety.  Helps control weight.  Helps build and maintain healthy bones, muscles and joints.  Helps older adults become stronger and better able to move about without falling.  Promotes psychological well-being.  If you have health problems or are over age 60 we recommend consulting your doctor before beginning. We recommend starting slow and working up. Start by reading the Healthnote on Starting an Exercise Program then get going. Do not over think the process. Pick something simple at home like walking with friends, using a treadmill, or riding an exercise bike. Experiment with different types of exercise until you find something you can tolerate (it does not have to be fun). Pick a 30 minute disc of inspirational music and listen to it while you exercise.   The more you do it the easier it will become and the better you will feel.  4. Eat 5 servings of fruits or vegetables daily.  A serving size is: One medium-size fruit  1/2 cup raw, cooked, frozen or canned fruits (in 100% juice) or vegetables  3/4 cup (6 oz.) 100% fruit or vegetable juice  1 cup raw, leafy vegetables  1/4 cup dried fruit   While this sounds easy enough actually getting this much fruits and vegetables takes some work and planning. You  will need to experiment with different types of fruits and vegetables to find ones you and your family can eat every day. Some simple tips include:  - Add fruit to your cereal each morning. - Eat a salad each day for lunch. The typical bowl of salad counts for 2 servings of vegetables.  - Have some fruits and vegetables at every meal. Use canned or frozen products if needed. - Eat fruits and vegetables for snacks - especially for the kids. - Replace the side of fries or chips with a cup of fruit, an apple, or a bowl of celery.  What are the benefits? Reduces heart disease and stroke. Possible reduction in cancer risk. Protects against the development of diabetes. Filling up on fat free fruits and vegetable decreases the amount of high fat foods you will eat, aiding in weight loss.  

## 2019-01-06 NOTE — Progress Notes (Signed)
   Subjective:  Patient ID: Courtney Allen, female    DOB: 08/13/1965, 53 y.o.   MRN: [id number]  Patient Care Team: ,  M, FNP as PCP - General (Family Medicine) Fields, Sandi L, MD as Consulting Physician (Gastroenterology)   Chief Complaint:  Medical Management of Chronic Issues   HPI: Courtney Allen is a 53 y.o. female presenting on 01/06/2019 for Medical Management of Chronic Issues   1. Dementia due to medical condition with behavioral disturbance (HCC)  Husband is primary care giver and is present with her today. He reports she has been doing very well. States she still complains of her back hurting at times. She does not cooperate with back strengthening and stretching exercises. States her mental status in the same, no increased confusion or agitation. States she is doing well on her current medications and tolerating them well. She is followed by neurology and sees them twice a year or more if needed.    2. Rash  Husband states she has an rash to the folds of her skin, states this happens a few times per year and she has to have cream to clear it up. No fever, chills, drainage, weakness, or fatigue. No known exposures.      Relevant past medical, surgical, family, and social history reviewed and updated as indicated.  Allergies and medications reviewed and updated. Date reviewed: Chart in Epic.   Past Medical History:  Diagnosis Date  . Bipolar I disorder, most recent episode (or current) unspecified   . Dementia (HCC)   . 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   . Depression   . Developmental delay   . Fetal alcohol syndrome 01/18/2013  . General learning disability 01/18/2013  . Memory loss   . Mental retardation, moderate (I.Q. 35-49) 01/18/2013  . Retinitis pigmentosa, both eyes 01/18/2013  . Urinary incontinence   . Urinary  incontinence due to cognitive impairment 01/18/2013    Past Surgical History:  Procedure Laterality Date  . CESAREAN SECTION    . TONSILLECTOMY    . TUBAL LIGATION      Social History   Socioeconomic History  . Marital status: Married    Spouse name: [name]  . Number of children: 3  . Years of education: 11  . Highest education level: Not on file  Occupational History  . Occupation: disabled  Social Needs  . Financial resource strain: Not on file  . Food insecurity    Worry: Not on file    Inability: Not on file  . Transportation needs    Medical: Not on file    Non-medical: Not on file  Tobacco Use  . Smoking status: Never Smoker  . Smokeless tobacco: Never Used  Substance and Sexual Activity  . Alcohol use: No  . Drug use: No  . Sexual activity: Not on file  Lifestyle  . Physical activity    Days per week: Not on file    Minutes per session: Not on file  . Stress: Not on file  Relationships  . Social connections    Talks on phone: Not on file    Gets together: Not on file    Attends religious service: Not on file    Active member of club or organization: Not on file    Attends meetings of clubs or organizations: Not on file    Relationship status: Not on file  . Intimate   partner violence    Fear of current or ex partner: Not on file    Emotionally abused: Not on file    Physically abused: Not on file    Forced sexual activity: Not on file  Other Topics Concern  . Not on file  Social History Narrative  . Not on file    Outpatient Encounter Medications as of 01/06/2019  Medication Sig  . buPROPion (WELLBUTRIN XL) 150 MG 24 hr tablet Take 1 tablet (150 mg total) by mouth daily.  . cholecalciferol (VITAMIN D) 1000 UNITS tablet Take 1,000 Units by mouth daily.  . hydrOXYzine (VISTARIL) 25 MG capsule TAKE 1 CAPSULE (25 MG TOTAL) BY MOUTH 2 (TWO) TIMES DAILY.  . mirtazapine (REMERON) 30 MG tablet Take 1 tablet (30 mg total) by mouth at bedtime.  .  nitrofurantoin, macrocrystal-monohydrate, (MACROBID) 100 MG capsule Take 1 capsule (100 mg total) by mouth daily.  Marland Kitchen nystatin ointment (MYCOSTATIN) Apply 1 application topically 2 (two) times daily.  . QUEtiapine (SEROQUEL) 50 MG tablet Take 1 tablet (50 mg total) by mouth daily for 30 days. (Patient taking differently: Take 50 mg by mouth 3 (three) times daily. )  . [DISCONTINUED] nystatin ointment (MYCOSTATIN) Apply 1 application topically 2 (two) times daily.  . hydrocortisone 2.5 % cream Apply topically 2 (two) times daily.  . [DISCONTINUED] hydrocortisone 2.5 % cream Apply topically 2 (two) times daily. (Patient not taking: Reported on 10/07/2018)  . [DISCONTINUED] QUEtiapine (SEROQUEL) 50 MG tablet Take 2 tablets (100 mg total) by mouth at bedtime for 30 days.   No facility-administered encounter medications on file as of 01/06/2019.     No Known Allergies  Review of Systems  Unable to perform ROS: Dementia (ROS per husband)  Constitutional: Negative.   HENT: Negative.   Eyes: Negative.   Respiratory: Negative.   Cardiovascular: Negative.   Gastrointestinal: Negative.   Endocrine: Negative.   Genitourinary: Negative.   Musculoskeletal: Positive for arthralgias and back pain.  Skin: Positive for rash. Negative for wound.  Allergic/Immunologic: Negative.   Neurological: Negative.   Hematological: Negative.   Psychiatric/Behavioral: Positive for agitation, behavioral problems, confusion (baseline) and sleep disturbance.        Objective:  BP 99/71   Pulse 60   Temp (!) 97.5 F (36.4 C)   Ht 5' 7" (1.702 m)   Wt 169 lb (76.7 kg)   SpO2 98%   BMI 26.47 kg/m    Wt Readings from Last 3 Encounters:  01/06/19 169 lb (76.7 kg)  10/07/18 164 lb (74.4 kg)  07/01/18 156 lb (70.8 kg)    Physical Exam Vitals signs and nursing note reviewed.  Constitutional:      Appearance: Normal appearance. She is overweight.  HENT:     Head: Normocephalic and atraumatic.     Right Ear:  Ear canal and external ear normal.     Left Ear: Tympanic membrane, ear canal and external ear normal.     Nose: Nose normal.     Mouth/Throat:     Mouth: Mucous membranes are moist.     Pharynx: Oropharynx is clear.  Eyes:     General: Visual field deficit present.     Conjunctiva/sclera: Conjunctivae normal.     Pupils: Pupils are equal, round, and reactive to light.  Neck:     Musculoskeletal: Normal range of motion and neck supple.  Cardiovascular:     Rate and Rhythm: Normal rate and regular rhythm.     Pulses: Normal pulses.  Heart sounds: Normal heart sounds. No murmur. No friction rub. No gallop.   Pulmonary:     Effort: Pulmonary effort is normal. No respiratory distress.     Breath sounds: Normal breath sounds.  Abdominal:     General: Bowel sounds are normal. There is no distension.     Palpations: Abdomen is soft.     Tenderness: There is no abdominal tenderness. There is no right CVA tenderness or left CVA tenderness.  Musculoskeletal: Normal range of motion.  Skin:    General: Skin is warm and dry.     Capillary Refill: Capillary refill takes less than 2 seconds.     Findings: Erythema and rash present. Rash is papular.     Comments: Beefy red rash with erythematous papules under bilateral breasts. Red raised rash to left arm. No drainage from either site.   Neurological:     General: No focal deficit present.     Mental Status: She is alert. Mental status is at baseline.     Cranial Nerves: No facial asymmetry.     Sensory: Sensation is intact.     Motor: No weakness.     Coordination: Romberg sign negative. Finger-Nose-Finger Test abnormal (slow). Impaired rapid alternating movements (slow).     Gait: Gait normal.  Psychiatric:        Attention and Perception: She is inattentive.        Speech: Speech is delayed (minimal).        Behavior: Behavior is slowed. Behavior is cooperative.        Cognition and Memory: Cognition is impaired. Memory is impaired.      Comments: Pt has dementia and MR. Pt will answer some questions but husband answers most questions for her. She is cooperative with exam. Smiles and laughs when spoken to.      Results for orders placed or performed in visit on 07/01/18  CMP14+EGFR  Result Value Ref Range   Glucose 78 65 - 99 mg/dL   BUN 13 6 - 24 mg/dL   Creatinine, Ser 0.85 0.57 - 1.00 mg/dL   GFR calc non Af Amer 78 >59 mL/min/1.73   GFR calc Af Amer 90 >59 mL/min/1.73   BUN/Creatinine Ratio 15 9 - 23   Sodium 140 134 - 144 mmol/L   Potassium 4.0 3.5 - 5.2 mmol/L   Chloride 104 96 - 106 mmol/L   CO2 23 20 - 29 mmol/L   Calcium 9.4 8.7 - 10.2 mg/dL   Total Protein 6.7 6.0 - 8.5 g/dL   Albumin 4.4 3.8 - 4.9 g/dL   Globulin, Total 2.3 1.5 - 4.5 g/dL   Albumin/Globulin Ratio 1.9 1.2 - 2.2   Bilirubin Total 0.4 0.0 - 1.2 mg/dL   Alkaline Phosphatase 77 39 - 117 IU/L   AST 14 0 - 40 IU/L   ALT 14 0 - 32 IU/L  CBC with Differential/Platelet  Result Value Ref Range   WBC 5.0 3.4 - 10.8 x10E3/uL   RBC 4.08 3.77 - 5.28 x10E6/uL   Hemoglobin 13.0 11.1 - 15.9 g/dL   Hematocrit 38.0 34.0 - 46.6 %   MCV 93 79 - 97 fL   MCH 31.9 26.6 - 33.0 pg   MCHC 34.2 31.5 - 35.7 g/dL   RDW 12.1 11.7 - 15.4 %   Platelets 192 150 - 450 x10E3/uL   Neutrophils 55 Not Estab. %   Lymphs 36 Not Estab. %   Monocytes 7 Not Estab. %   Eos 1 Not   Estab. %   Basos 1 Not Estab. %   Neutrophils Absolute 2.7 1.4 - 7.0 x10E3/uL   Lymphocytes Absolute 1.8 0.7 - 3.1 x10E3/uL   Monocytes Absolute 0.4 0.1 - 0.9 x10E3/uL   EOS (ABSOLUTE) 0.1 0.0 - 0.4 x10E3/uL   Basophils Absolute 0.1 0.0 - 0.2 x10E3/uL   Immature Granulocytes 0 Not Estab. %   Immature Grans (Abs) 0.0 0.0 - 0.1 x10E3/uL  TSH  Result Value Ref Range   TSH 2.030 0.450 - 4.500 uIU/mL  Lipid panel  Result Value Ref Range   Cholesterol, Total 184 100 - 199 mg/dL   Triglycerides 86 0 - 149 mg/dL   HDL 57 >39 mg/dL   VLDL Cholesterol Cal 17 5 - 40 mg/dL   LDL Calculated 110  (H) 0 - 99 mg/dL   Chol/HDL Ratio 3.2 0.0 - 4.4 ratio  IGP, Aptima HPV, rfx 16/18,45  Result Value Ref Range   Interpretation NILM    Category NIL    Adequacy ENDO    Clinician Provided ICD10 Comment    Performed by: Comment    Note: Comment    Test Methodology Comment    HPV Aptima Negative Negative       Pertinent labs & imaging results that were available during my care of the patient were reviewed by me and considered in my medical decision making.  Assessment & Plan:  Courtney Allen was seen today for medical management of chronic issues.  Diagnoses and all orders for this visit:  Dementia due to medical condition with behavioral disturbance (HCC) No changes in symptoms. Followed by neurology. Keep follow up appointments as scheduled.   Candidal skin infection Irritant contact dermatitis, unspecified trigger Irritant contact dermatitis to arm. Unknown cause. Hydrocortisone cream as prescribed. Report any new or worsening symptoms. Candidal rash under bilateral breasts. Symptomatic and preventative care discussed. Nystatin ointment as prescribed. Report any new or worsening symptoms.  -     nystatin ointment (MYCOSTATIN); Apply 1 application topically 2 (two) times daily. -     hydrocortisone 2.5 % cream; Apply topically 2 (two) times daily.  High risk medication use Will check labs today due to high risk medications.  -     CMP14+EGFR -     CBC with Differential/Platelet  Need for immunization against influenza -     Flu Vaccine QUAD 36+ mos IM     Continue all other maintenance medications.  Follow up plan: Return in about 6 months (around 07/06/2019), or if symptoms worsen or fail to improve.  Continue healthy lifestyle choices, including diet (rich in fruits, vegetables, and lean proteins, and low in salt and simple carbohydrates) and exercise (at least 30 minutes of moderate physical activity daily).  Educational handout given for healthy living   The above assessment  and management plan was discussed with the patient. The patient verbalized understanding of and has agreed to the management plan. Patient is aware to call the clinic if symptoms persist or worsen. Patient is aware when to return to the clinic for a follow-up visit. Patient educated on when it is appropriate to go to the emergency department.   Michelle , FNP-C [facility] [phone]   

## 2019-01-07 LAB — CBC WITH DIFFERENTIAL/PLATELET
Basophils Absolute: 0 10*3/uL (ref 0.0–0.2)
Basos: 1 %
EOS (ABSOLUTE): 0.1 10*3/uL (ref 0.0–0.4)
Eos: 3 %
Hematocrit: 37.8 % (ref 34.0–46.6)
Hemoglobin: 12.9 g/dL (ref 11.1–15.9)
Immature Grans (Abs): 0 10*3/uL (ref 0.0–0.1)
Immature Granulocytes: 0 %
Lymphocytes Absolute: 1.7 10*3/uL (ref 0.7–3.1)
Lymphs: 42 %
MCH: 31.5 pg (ref 26.6–33.0)
MCHC: 34.1 g/dL (ref 31.5–35.7)
MCV: 92 fL (ref 79–97)
Monocytes Absolute: 0.3 10*3/uL (ref 0.1–0.9)
Monocytes: 8 %
Neutrophils Absolute: 1.9 10*3/uL (ref 1.4–7.0)
Neutrophils: 46 %
Platelets: 169 10*3/uL (ref 150–450)
RBC: 4.09 x10E6/uL (ref 3.77–5.28)
RDW: 12 % (ref 11.7–15.4)
WBC: 4.1 10*3/uL (ref 3.4–10.8)

## 2019-01-07 LAB — CMP14+EGFR
ALT: 19 IU/L (ref 0–32)
AST: 16 IU/L (ref 0–40)
Albumin/Globulin Ratio: 2.4 — ABNORMAL HIGH (ref 1.2–2.2)
Albumin: 4.5 g/dL (ref 3.8–4.9)
Alkaline Phosphatase: 81 IU/L (ref 39–117)
BUN/Creatinine Ratio: 17 (ref 9–23)
BUN: 16 mg/dL (ref 6–24)
Bilirubin Total: 0.4 mg/dL (ref 0.0–1.2)
CO2: 24 mmol/L (ref 20–29)
Calcium: 9.5 mg/dL (ref 8.7–10.2)
Chloride: 108 mmol/L — ABNORMAL HIGH (ref 96–106)
Creatinine, Ser: 0.94 mg/dL (ref 0.57–1.00)
GFR calc Af Amer: 80 mL/min/{1.73_m2} (ref >59)
GFR calc non Af Amer: 69 mL/min/{1.73_m2} (ref >59)
Globulin, Total: 1.9 g/dL (ref 1.5–4.5)
Glucose: 86 mg/dL (ref 65–99)
Potassium: 4.6 mmol/L (ref 3.5–5.2)
Sodium: 143 mmol/L (ref 134–144)
Total Protein: 6.4 g/dL (ref 6.0–8.5)

## 2019-01-11 ENCOUNTER — Telehealth: Payer: Self-pay | Admitting: Family Medicine

## 2019-01-11 NOTE — Telephone Encounter (Signed)
You can not use ketaconazole cream in the mouth. Where is fugus infection

## 2019-01-23 NOTE — Telephone Encounter (Signed)
Multiple attempts made to contact patient's husband.  This encounter will now be closed.

## 2019-04-11 ENCOUNTER — Ambulatory Visit (INDEPENDENT_AMBULATORY_CARE_PROVIDER_SITE_OTHER): Payer: BC Managed Care – PPO | Admitting: Physician Assistant

## 2019-04-11 ENCOUNTER — Encounter: Payer: Self-pay | Admitting: Physician Assistant

## 2019-04-11 DIAGNOSIS — N390 Urinary tract infection, site not specified: Secondary | ICD-10-CM | POA: Diagnosis not present

## 2019-04-11 MED ORDER — NITROFURANTOIN MONOHYD MACRO 100 MG PO CAPS: 100.0000 mg | ORAL_CAPSULE | Freq: Every day | ORAL | 1 refills | Status: DC

## 2019-04-11 NOTE — Progress Notes (Signed)
Telephone visit  Subjective: CC: History of urinary tract infection PCP: Baruch Gouty, FNP NAT:FTDDU Courtney Allen is a 53 y.o. female calls for telephone consult today. Patient provides verbal consent for consult held via phone.  Patient is identified with 2 separate identifiers.  At this time the entire area is on COVID-19 social distancing and stay home orders are in place.  Patient is of higher risk and therefore we are performing this by a virtual method.  Location of patient: Home Location of provider: [facility] Others present for call: Husband, patient with dementia   The patient has dementia and her husband did the speaking for her.    He reports that the purpose of the visit was not because of urinary tract infection but to have her nitrofurantoin will refill.  She has been taking this for couple years and has done excellent job in preventing urinary tract infections.  The pharmacy had made mention that she may be on it for too long.  But at this point it has done such a good job greatly should continue it and they can have follow-up with your PCP.  I will forward this message on to [facility].   ROS: Per HPI  No Known Allergies Past Medical History:  Diagnosis Date  . Bipolar I disorder, most recent episode (or current) unspecified   . Dementia ([place])   . 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   . Depression   . Developmental delay   . Fetal alcohol syndrome 01/18/2013  . General learning disability 01/18/2013  . Memory loss   . Mental retardation, moderate (I.Q. 35-49) 01/18/2013  . Retinitis pigmentosa, both eyes 01/18/2013  . Urinary incontinence   . Urinary incontinence due to cognitive impairment 01/18/2013    Current Outpatient Medications:  .  buPROPion (WELLBUTRIN XL) 150 MG 24 hr tablet, Take 1 tablet (150 mg total) by mouth daily.,  Disp: 90 tablet, Rfl: 3 .  cholecalciferol (VITAMIN D) 1000 UNITS tablet, Take 1,000 Units by mouth daily., Disp: , Rfl:  .  hydrocortisone 2.5 % cream, Apply topically 2 (two) times daily., Disp: 30 g, Rfl: 0 .  hydrOXYzine (VISTARIL) 25 MG capsule, TAKE 1 CAPSULE (25 MG TOTAL) BY MOUTH 2 (TWO) TIMES DAILY., Disp: 180 capsule, Rfl: 0 .  mirtazapine (REMERON) 30 MG tablet, Take 1 tablet (30 mg total) by mouth at bedtime., Disp: 30 tablet, Rfl: 1 .  nitrofurantoin, macrocrystal-monohydrate, (MACROBID) 100 MG capsule, Take 1 capsule (100 mg total) by mouth daily., Disp: 90 capsule, Rfl: 1 .  nystatin ointment (MYCOSTATIN), Apply 1 application topically 2 (two) times daily., Disp: 30 g, Rfl: 1 .  QUEtiapine (SEROQUEL) 50 MG tablet, Take 1 tablet (50 mg total) by mouth daily for 30 days. (Patient taking differently: Take 50 mg by mouth 3 (three) times daily. ), Disp: 30 tablet, Rfl: 3  Assessment/ Plan: 53 y.o. female   1. Frequent UTI - nitrofurantoin, macrocrystal-monohydrate, (MACROBID) 100 MG capsule; Take 1 capsule (100 mg total) by mouth daily.  Dispense: 90 capsule; Refill: 1   No follow-ups on file.  Continue all other maintenance medications as listed above.  Start time: 9:27 AM End time: 9:36 AM  Meds ordered this encounter  Medications  . nitrofurantoin, macrocrystal-monohydrate, (MACROBID) 100 MG capsule    Sig: Take 1 capsule (100 mg total) by mouth daily.    Dispense:  90 capsule  Refill:  1    Order Specific Question:   Supervising Provider    Answer:   Raliegh Ip [[id number]]    Prudy Feeler PA-C [facility] [phone]

## 2019-04-17 ENCOUNTER — Telehealth: Payer: Self-pay | Admitting: Family Medicine

## 2019-04-17 ENCOUNTER — Telehealth: Payer: Self-pay | Admitting: *Deleted

## 2019-04-17 NOTE — Telephone Encounter (Addendum)
Prior Auth for Nitrofurantoin 100mg  caps-APPROVED through 04/18/20  Ref # [id number]  Turn around time is up to 5 days

## 2019-04-30 ENCOUNTER — Other Ambulatory Visit: Payer: Self-pay | Admitting: Family Medicine

## 2019-05-24 DIAGNOSIS — Z6824 Body mass index (BMI) 24.0-24.9, adult: Secondary | ICD-10-CM | POA: Diagnosis not present

## 2019-05-24 DIAGNOSIS — Z20822 Contact with and (suspected) exposure to covid-19: Secondary | ICD-10-CM | POA: Diagnosis not present

## 2019-05-24 DIAGNOSIS — R509 Fever, unspecified: Secondary | ICD-10-CM | POA: Diagnosis not present

## 2019-07-07 ENCOUNTER — Other Ambulatory Visit: Payer: Self-pay

## 2019-07-07 DIAGNOSIS — H3552 Pigmentary retinal dystrophy: Secondary | ICD-10-CM | POA: Diagnosis not present

## 2019-07-10 ENCOUNTER — Other Ambulatory Visit: Payer: Self-pay

## 2019-07-10 ENCOUNTER — Ambulatory Visit: Payer: BC Managed Care – PPO | Admitting: Family Medicine

## 2019-07-10 ENCOUNTER — Encounter: Payer: Self-pay | Admitting: Family Medicine

## 2019-07-10 VITALS — BP 91/55 | HR 68 | Temp 96.8°F | Resp 20 | Ht 67.0 in | Wt 168.0 lb

## 2019-07-10 DIAGNOSIS — F0281 Dementia in other diseases classified elsewhere with behavioral disturbance: Secondary | ICD-10-CM

## 2019-07-10 DIAGNOSIS — H3552 Pigmentary retinal dystrophy: Secondary | ICD-10-CM | POA: Diagnosis not present

## 2019-07-10 DIAGNOSIS — Z0000 Encounter for general adult medical examination without abnormal findings: Secondary | ICD-10-CM | POA: Diagnosis not present

## 2019-07-10 DIAGNOSIS — Z79899 Other long term (current) drug therapy: Secondary | ICD-10-CM

## 2019-07-10 DIAGNOSIS — Z6826 Body mass index (BMI) 26.0-26.9, adult: Secondary | ICD-10-CM

## 2019-07-10 DIAGNOSIS — F02818 Dementia in other diseases classified elsewhere, unspecified severity, with other behavioral disturbance: Secondary | ICD-10-CM

## 2019-07-10 NOTE — Patient Instructions (Signed)
Dementia Caregiver Guide Dementia is a term used to describe a number of symptoms that affect memory and thinking. The most common symptoms include:  Memory loss.  Trouble with language and communication.  Trouble concentrating.  Poor judgment.  Problems with reasoning.  Child-like behavior and language.  Extreme anxiety.  Angry outbursts.  Wandering from home or public places. Dementia usually gets worse slowly over time. In the early stages, people with dementia can stay independent and safe with some help. In later stages, they need help with daily tasks such as dressing, grooming, and using the bathroom. How to help the person with dementia cope Dementia can be frightening and confusing. Here are some tips to help the person with dementia cope with changes caused by the disease. General tips  Keep the person on track with his or her routine.  Try to identify areas where the person may need help.  Be supportive, patient, calm, and encouraging.  Gently remind the person that adjusting to changes takes time.  Help with the tasks that the person has asked for help with.  Keep the person involved in daily tasks and decisions as much as possible.  Encourage conversation, but try not to get frustrated or harried if the person struggles to find words or does not seem to appreciate your help. Communication tips  When the person is talking or seems frustrated, make eye contact and hold the person's hand.  Ask specific questions that need yes or no answers.  Use simple words, short sentences, and a calm voice. Only give one direction at a time.  When offering choices, limit them to just 1 or 2.  Avoid correcting the person in a negative way.  If the person is struggling to find the right words, gently try to help him or her. How to recognize symptoms of stress Symptoms of stress in caregivers include:  Feeling frustrated or angry with the person with  dementia.  Denying that the person has dementia or that his or her symptoms will not improve.  Feeling hopeless and unappreciated.  Difficulty sleeping.  Difficulty concentrating.  Feeling anxious, irritable, or depressed.  Developing stress-related health problems.  Feeling like you have too little time for your own life. Follow these instructions at home:   Make sure that you and the person you are caring for: ? Get regular sleep. ? Exercise regularly. ? Eat regular, nutritious meals. ? Drink enough fluid to keep your urine clear or pale yellow. ? Take over-the-counter and prescription medicines only as told by your health care providers. ? Attend all scheduled health care appointments.  Join a support group with others who are caregivers.  Ask about respite care resources so that you can have a regular break from the stress of caregiving.  Look for signs of stress in yourself and in the person you are caring for. If you notice signs of stress, take steps to manage it.  Consider any safety risks and take steps to avoid them.  Organize medications in a pill box for each day of the week.  Create a plan to handle any legal or financial matters. Get legal or financial advice if needed.  Keep a calendar in a central location to remind the person of appointments or other activities. Tips for reducing the risk of injury  Keep floors clear of clutter. Remove rugs, magazine racks, and floor lamps.  Keep hallways well lit, especially at night.  Put a handrail and nonslip mat in the bathtub or   shower.  Put childproof locks on cabinets that contain dangerous items, such as medicines, alcohol, guns, toxic cleaning items, sharp tools or utensils, matches, and lighters.  Put the locks in places where the person cannot see or reach them easily. This will help ensure that the person does not wander out of the house and get lost.  Be prepared for emergencies. Keep a list of  emergency phone numbers and addresses in a convenient area.  Remove car keys and lock garage doors so that the person does not try to get in the car and drive.  Have the person wear a bracelet that tracks locations and identifies the person as having memory problems. This should be worn at all times for safety. Where to find support: Many individuals and organizations offer support. These include:  Support groups for people with dementia and for caregivers.  Counselors or therapists.  Home health care services.  Adult day care centers. Where to find more information Alzheimer's Association: www.alz.org Contact a health care provider if:  The person's health is rapidly getting worse.  You are no longer able to care for the person.  Caring for the person is affecting your physical and emotional health.  The person threatens himself or herself, you, or anyone else. Summary  Dementia is a term used to describe a number of symptoms that affect memory and thinking.  Dementia usually gets worse slowly over time.  Take steps to reduce the person's risk of injury, and to plan for future care.  Caregivers need support, relief from caregiving, and time for their own lives. This information is not intended to replace advice given to you by your health care provider. Make sure you discuss any questions you have with your health care provider. Document Revised: 04/02/2017 Document Reviewed: 03/24/2016 Elsevier Patient Education  2020 Elsevier Inc.  

## 2019-07-10 NOTE — Progress Notes (Signed)
Subjective:  Patient ID: Courtney Allen, female    DOB: Jul 27, 1965, 54 y.o.   MRN: [id number]  Patient Care Team: Baruch Gouty, FNP as PCP - General (Family Medicine) Danie Binder, MD as Consulting Physician (Gastroenterology)   Chief Complaint:  Medical Management of Chronic Issues (6 mo )   HPI: Courtney Allen is a 53 y.o. female presenting on 07/10/2019 for Medical Management of Chronic Issues (6 mo )   HPI 1. Dementia due to medical condition with behavioral disturbance [facility]) Patient's husband, who is primary caregiver. reports he feels that she, overall,  is deteriorating more. He states he encourages her to do as many ADLs as she is able, however she is requiring more assistance with eating and dressing.   2. Retinitis Pigementosa Saw eye doctor within the past month. Patient's husband/caregiver feels that her vision is declining and she needs more help with daily activities, partially due to her vision loss.     Relevant past medical, surgical, family, and social history reviewed and updated as indicated.  Allergies and medications reviewed and updated. Date reviewed: Chart in Epic.   Past Medical History:  Diagnosis Date  . Bipolar I disorder, most recent episode (or current) unspecified   . Dementia ([place])   . 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   . Depression   . Developmental delay   . Fetal alcohol syndrome 01/18/2013  . General learning disability 01/18/2013  . Memory loss   . Mental retardation, moderate (I.Q. 35-49) 01/18/2013  . Retinitis pigmentosa, both eyes 01/18/2013  . Urinary incontinence   . Urinary incontinence due to cognitive impairment 01/18/2013    Past Surgical History:  Procedure Laterality Date  . CESAREAN SECTION    . TONSILLECTOMY    . TUBAL LIGATION      Social History   Socioeconomic History  .  Marital status: Married    Spouse name: Jenny Reichmann  . Number of children: 3  . Years of education: 73  . Highest education level: Not on file  Occupational History  . Occupation: disabled  Tobacco Use  . Smoking status: Never Smoker  . Smokeless tobacco: Never Used  Substance and Sexual Activity  . Alcohol use: No  . Drug use: No  . Sexual activity: Not on file  Other Topics Concern  . Not on file  Social History Narrative  . Not on file   Social Determinants of Health   Financial Resource Strain:   . Difficulty of Paying Living Expenses: Not on file  Food Insecurity:   . Worried About Charity fundraiser in the Last Year: Not on file  . Ran Out of Food in the Last Year: Not on file  Transportation Needs:   . Lack of Transportation (Medical): Not on file  . Lack of Transportation (Non-Medical): Not on file  Physical Activity:   . Days of Exercise per Week: Not on file  . Minutes of Exercise per Session: Not on file  Stress:   . Feeling of Stress : Not on file  Social Connections:   . Frequency of Communication with Friends and Family: Not on file  . Frequency of Social Gatherings with Friends and Family: Not on file  . Attends Religious Services: Not on file  . Active Member of Clubs or Organizations: Not on file  . Attends Archivist Meetings: Not on file  .  Marital Status: Not on file  Intimate Partner Violence:   . Fear of Current or Ex-Partner: Not on file  . Emotionally Abused: Not on file  . Physically Abused: Not on file  . Sexually Abused: Not on file    Outpatient Encounter Medications as of 07/10/2019  Medication Sig  . buPROPion (WELLBUTRIN XL) 150 MG 24 hr tablet Take 1 tablet (150 mg total) by mouth daily.  . cholecalciferol (VITAMIN D) 1000 UNITS tablet Take 1,000 Units by mouth daily.  . hydrOXYzine (VISTARIL) 25 MG capsule TAKE 1 CAPSULE (25 MG TOTAL) BY MOUTH 2 (TWO) TIMES DAILY.  . mirtazapine (REMERON) 30 MG tablet Take 1 tablet (30 mg total)  by mouth at bedtime.  Marland Kitchen QUEtiapine (SEROQUEL) 50 MG tablet Take 1 tablet (50 mg total) by mouth daily for 30 days. (Patient taking differently: Take 50 mg by mouth 3 (three) times daily. )  . hydrocortisone 2.5 % cream Apply topically 2 (two) times daily. (Patient not taking: Reported on 07/10/2019)  . hydrOXYzine (ATARAX/VISTARIL) 25 MG tablet TAKE 1 OR 2 TABLETS BY MOUTH AT BEDTIME AS NEEDED FOR AGITATION (Patient not taking: Reported on 07/10/2019)  . nystatin ointment (MYCOSTATIN) Apply 1 application topically 2 (two) times daily. (Patient not taking: Reported on 07/10/2019)  . [DISCONTINUED] nitrofurantoin, macrocrystal-monohydrate, (MACROBID) 100 MG capsule Take 1 capsule (100 mg total) by mouth daily.   No facility-administered encounter medications on file as of 07/10/2019.    No Known Allergies  Review of Systems  Constitutional: Negative for activity change, appetite change, fatigue and unexpected weight change.       Unable to perform comprehensive ROS due to dementia, husband provided some history  HENT: Negative.   Eyes: Positive for visual disturbance.       Known retinitis pigemntosa  Respiratory: Negative.   Cardiovascular: Negative for chest pain, palpitations and leg swelling.  Gastrointestinal: Negative for abdominal pain, constipation, diarrhea and nausea.  Endocrine: Negative for polydipsia, polyphagia and polyuria.  Genitourinary: Negative for difficulty urinating and frequency.  Musculoskeletal: Negative for gait problem and myalgias.  Skin: Negative for color change, pallor and rash.  Allergic/Immunologic: Negative.   Neurological: Negative for weakness.  Hematological: Negative.   Psychiatric/Behavioral: Positive for behavioral problems, confusion, decreased concentration and sleep disturbance. The patient is nervous/anxious.        Baseline problems  All other systems reviewed and are negative.       Objective:  BP (!) 91/55   Pulse 68   Temp (!) 96.8 F (36  C)   Resp 20   Ht 5' 7"  (1.702 m)   Wt 168 lb (76.2 kg)   SpO2 99%   BMI 26.31 kg/m    Wt Readings from Last 3 Encounters:  07/10/19 168 lb (76.2 kg)  01/06/19 169 lb (76.7 kg)  10/07/18 164 lb (74.4 kg)    Physical Exam Vitals and nursing note reviewed.  Constitutional:      Appearance: Normal appearance. She is normal weight.  HENT:     Head: Normocephalic and atraumatic.     Nose: Nose normal.     Mouth/Throat:     Mouth: Mucous membranes are moist.  Eyes:     Pupils: Pupils are equal, round, and reactive to light.  Cardiovascular:     Rate and Rhythm: Normal rate and regular rhythm.     Pulses: Normal pulses.     Heart sounds: Normal heart sounds.  Pulmonary:     Effort: Pulmonary effort is normal.  Breath sounds: Normal breath sounds.  Abdominal:     General: Abdomen is flat. Bowel sounds are normal. There is no distension.     Palpations: Abdomen is soft.  Musculoskeletal:        General: Normal range of motion.     Cervical back: Normal range of motion and neck supple.  Skin:    General: Skin is warm and dry.     Capillary Refill: Capillary refill takes less than 2 seconds.  Neurological:     General: No focal deficit present.     Mental Status: She is alert. Mental status is at baseline. She is disoriented.  Psychiatric:        Attention and Perception: She is attentive.        Mood and Affect: Mood is anxious.        Speech: Speech normal.        Cognition and Memory: Cognition is impaired. Memory is impaired.     Results for orders placed or performed in visit on 01/06/19  CMP14+EGFR  Result Value Ref Range   Glucose 86 65 - 99 mg/dL   BUN 16 6 - 24 mg/dL   Creatinine, Ser 0.94 0.57 - 1.00 mg/dL   GFR calc non Af Amer 69 >59 mL/min/1.73   GFR calc Af Amer 80 >59 mL/min/1.73   BUN/Creatinine Ratio 17 9 - 23   Sodium 143 134 - 144 mmol/L   Potassium 4.6 3.5 - 5.2 mmol/L   Chloride 108 (H) 96 - 106 mmol/L   CO2 24 20 - 29 mmol/L   Calcium  9.5 8.7 - 10.2 mg/dL   Total Protein 6.4 6.0 - 8.5 g/dL   Albumin 4.5 3.8 - 4.9 g/dL   Globulin, Total 1.9 1.5 - 4.5 g/dL   Albumin/Globulin Ratio 2.4 (H) 1.2 - 2.2   Bilirubin Total 0.4 0.0 - 1.2 mg/dL   Alkaline Phosphatase 81 39 - 117 IU/L   AST 16 0 - 40 IU/L   ALT 19 0 - 32 IU/L  CBC with Differential/Platelet  Result Value Ref Range   WBC 4.1 3.4 - 10.8 x10E3/uL   RBC 4.09 3.77 - 5.28 x10E6/uL   Hemoglobin 12.9 11.1 - 15.9 g/dL   Hematocrit 37.8 34.0 - 46.6 %   MCV 92 79 - 97 fL   MCH 31.5 26.6 - 33.0 pg   MCHC 34.1 31.5 - 35.7 g/dL   RDW 12.0 11.7 - 15.4 %   Platelets 169 150 - 450 x10E3/uL   Neutrophils 46 Not Estab. %   Lymphs 42 Not Estab. %   Monocytes 8 Not Estab. %   Eos 3 Not Estab. %   Basos 1 Not Estab. %   Neutrophils Absolute 1.9 1.4 - 7.0 x10E3/uL   Lymphocytes Absolute 1.7 0.7 - 3.1 x10E3/uL   Monocytes Absolute 0.3 0.1 - 0.9 x10E3/uL   EOS (ABSOLUTE) 0.1 0.0 - 0.4 x10E3/uL   Basophils Absolute 0.0 0.0 - 0.2 x10E3/uL   Immature Granulocytes 0 Not Estab. %   Immature Grans (Abs) 0.0 0.0 - 0.1 x10E3/uL       Pertinent labs & imaging results that were available during my care of the patient were reviewed by me and considered in my medical decision making.  Assessment & Plan:  Annalise was seen today for medical management of chronic issues.  Diagnoses and all orders for this visit:  Dementia due to medical condition with behavioral disturbance [facility]) Doing well, continue current medications.  -  CBC with Differential -     Comprehensive metabolic panel -     Lipid Panel  Retinitis pigmentosa Continue to follow up with specialist as scheduled.   High risk medication use -     CBC with Differential -     Comprehensive metabolic panel -     Lipid Panel  BMI 26.0-26.9,adult Diet and exercise encouraged. Labs pending.  -     CBC with Differential -     Comprehensive metabolic panel -     Lipid Panel     Continue all other maintenance  medications.  Follow up plan: Return in about 6 months (around 01/10/2020), or if symptoms worsen or fail to improve.  Continue healthy lifestyle choices, including diet (rich in fruits, vegetables, and lean proteins, and low in salt and simple carbohydrates) and exercise (at least 30 minutes of moderate physical activity daily).     The above assessment and management plan was discussed with the patient. The patient verbalized understanding of and has agreed to the management plan. Patient is aware to call the clinic if they develop any new symptoms or if symptoms persist or worsen. Patient is aware when to return to the clinic for a follow-up visit. Patient educated on when it is appropriate to go to the emergency department.   Scherrie Gerlach, BSN, RN, AGNP-Student   I personally was present during the history, physical exam, and medical decision-making activities of this service and have verified that the service and findings are accurately documented in the nurse practitioner student's note.  Monia Pouch, FNP-C [facility] [address] [place], [place] [address] [phone]

## 2019-07-11 LAB — COMPREHENSIVE METABOLIC PANEL
ALT: 54 IU/L — ABNORMAL HIGH (ref 0–32)
AST: 33 IU/L (ref 0–40)
Albumin/Globulin Ratio: 1.6 (ref 1.2–2.2)
Albumin: 4.1 g/dL (ref 3.8–4.9)
Alkaline Phosphatase: 96 IU/L (ref 39–117)
BUN/Creatinine Ratio: 18 (ref 9–23)
BUN: 17 mg/dL (ref 6–24)
Bilirubin Total: 0.3 mg/dL (ref 0.0–1.2)
CO2: 22 mmol/L (ref 20–29)
Calcium: 9.6 mg/dL (ref 8.7–10.2)
Chloride: 107 mmol/L — ABNORMAL HIGH (ref 96–106)
Creatinine, Ser: 0.96 mg/dL (ref 0.57–1.00)
GFR calc Af Amer: 78 mL/min/{1.73_m2} (ref >59)
GFR calc non Af Amer: 67 mL/min/{1.73_m2} (ref >59)
Globulin, Total: 2.5 g/dL (ref 1.5–4.5)
Glucose: 90 mg/dL (ref 65–99)
Potassium: 4.2 mmol/L (ref 3.5–5.2)
Sodium: 141 mmol/L (ref 134–144)
Total Protein: 6.6 g/dL (ref 6.0–8.5)

## 2019-07-11 LAB — CBC WITH DIFFERENTIAL/PLATELET
Basophils Absolute: 0.1 10*3/uL (ref 0.0–0.2)
Basos: 2 %
EOS (ABSOLUTE): 0.1 10*3/uL (ref 0.0–0.4)
Eos: 3 %
Hematocrit: 39.6 % (ref 34.0–46.6)
Hemoglobin: 12.9 g/dL (ref 11.1–15.9)
Immature Grans (Abs): 0 10*3/uL (ref 0.0–0.1)
Immature Granulocytes: 0 %
Lymphocytes Absolute: 1.4 10*3/uL (ref 0.7–3.1)
Lymphs: 35 %
MCH: 30 pg (ref 26.6–33.0)
MCHC: 32.6 g/dL (ref 31.5–35.7)
MCV: 92 fL (ref 79–97)
Monocytes Absolute: 0.3 10*3/uL (ref 0.1–0.9)
Monocytes: 8 %
Neutrophils Absolute: 2.2 10*3/uL (ref 1.4–7.0)
Neutrophils: 52 %
Platelets: 198 10*3/uL (ref 150–450)
RBC: 4.3 x10E6/uL (ref 3.77–5.28)
RDW: 13 % (ref 11.7–15.4)
WBC: 4.1 10*3/uL (ref 3.4–10.8)

## 2019-07-11 LAB — LIPID PANEL
Chol/HDL Ratio: 3.3 ratio (ref 0.0–4.4)
Cholesterol, Total: 184 mg/dL (ref 100–199)
HDL: 55 mg/dL (ref >39)
LDL Chol Calc (NIH): 113 mg/dL — ABNORMAL HIGH (ref 0–99)
Triglycerides: 87 mg/dL (ref 0–149)
VLDL Cholesterol Cal: 16 mg/dL (ref 5–40)

## 2019-07-13 ENCOUNTER — Telehealth: Payer: Self-pay | Admitting: Family Medicine

## 2019-07-13 NOTE — Telephone Encounter (Signed)
Mailed

## 2019-08-08 DIAGNOSIS — Z1231 Encounter for screening mammogram for malignant neoplasm of breast: Secondary | ICD-10-CM | POA: Diagnosis not present

## 2019-09-15 ENCOUNTER — Encounter: Payer: Self-pay | Admitting: Nurse Practitioner

## 2019-09-15 ENCOUNTER — Ambulatory Visit (INDEPENDENT_AMBULATORY_CARE_PROVIDER_SITE_OTHER): Payer: BC Managed Care – PPO | Admitting: Nurse Practitioner

## 2019-09-15 ENCOUNTER — Other Ambulatory Visit: Payer: Self-pay

## 2019-09-15 DIAGNOSIS — Z0000 Encounter for general adult medical examination without abnormal findings: Secondary | ICD-10-CM | POA: Diagnosis not present

## 2019-09-15 DIAGNOSIS — Z1231 Encounter for screening mammogram for malignant neoplasm of breast: Secondary | ICD-10-CM

## 2019-09-15 DIAGNOSIS — Z1211 Encounter for screening for malignant neoplasm of colon: Secondary | ICD-10-CM | POA: Diagnosis not present

## 2019-09-15 DIAGNOSIS — Z01419 Encounter for gynecological examination (general) (routine) without abnormal findings: Secondary | ICD-10-CM

## 2019-09-15 NOTE — Assessment & Plan Note (Signed)
Annual wellness exam completed. Extensive preventative and health maintenance education provided. patient's caregiver wants colonoscopy and shingles vaccine done later. Mammogram was completed about a month ago per caregiver, lab work CMP, CBC and  LIPIDS  in march. Patient will follow up in 1  year

## 2019-09-15 NOTE — Progress Notes (Signed)
Established Patient Office Visit  Subjective:  Patient ID: Courtney Allen, female    DOB: [birth year]  Age: 54 y.o. MRN: [id number]  CC:  Chief Complaint  Patient presents with  . Annual Exam    HPI Courtney Allen presents for annual wellness visit  .   Encounter for general adult medical examination without abnormal findings  Physical: Patient's last physical exam was 2  year ago .  Weight: not appropriate for height (BMI  Is 26.08%) ;  Blood Pressure: Normal (BP less than 120/80) ;  Medical History: Patient history reviewed ; Family history reviewed ;  Allergies Reviewed: No change in current allergies ;  Medications Reviewed: Medications reviewed - no changes ;  Lipids: abnormal lipid levels ;  Smoking: Life-long non-smoker ;  Physical Activity: no exercise regimen for now Alcohol/Drug Use: Is a non-drinker ; No illicit drug use ;  Patient is not afflicted from Stress Incontinence and Urge Incontinence  Safety: reviewed ; Patient wears a seat belt, has smoke detectors, has carbon monoxide detectors, practices appropriate gun safety, and wears sunscreen with extended sun exposure. Dental Care: biannual cleanings, brushes and flosses daily. Ophthalmology/Optometry: Annual visit.  Hearing loss: none Vision impairments: none  Past Medical History:  Diagnosis Date  . Bipolar I disorder, most recent episode (or current) unspecified   . Dementia ([place])   . 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   . Depression   . Developmental delay   . Fetal alcohol syndrome 01/18/2013  . General learning disability 01/18/2013  . Memory loss   . Mental retardation, moderate (I.Q. 35-49) 01/18/2013  . Retinitis pigmentosa, both eyes 01/18/2013  . Urinary incontinence   . Urinary incontinence due to cognitive impairment 01/18/2013    Past Surgical History:    Procedure Laterality Date  . CESAREAN SECTION    . TONSILLECTOMY    . TUBAL LIGATION      Family History  Problem Relation Age of Onset  . Alcohol abuse Mother   . Heart attack Mother 70  . Diabetes Mother   . Cancer Mother        Deceased in mid 7s, either colon cancer or pancreatic cancer reported per patient's spouse  . Emphysema Brother   . Diabetes Brother     Social History   Socioeconomic History  . Marital status: Married    Spouse name: Jenny Reichmann  . Number of children: 3  . Years of education: 53  . Highest education level: Not on file  Occupational History  . Occupation: disabled  Tobacco Use  . Smoking status: Never Smoker  . Smokeless tobacco: Never Used  Substance and Sexual Activity  . Alcohol use: No  . Drug use: No  . Sexual activity: Not on file  Other Topics Concern  . Not on file  Social History Narrative  . Not on file   Social Determinants of Health   Financial Resource Strain:   . Difficulty of Paying Living Expenses:   Food Insecurity:   . Worried About Charity fundraiser in the Last Year:   . Arboriculturist in the Last Year:   Transportation Needs:   . Film/video editor (Medical):   Marland Kitchen Lack of Transportation (Non-Medical):   Physical Activity:   . Days of Exercise per Week:   . Minutes of Exercise per Session:   Stress:   . Feeling of Stress :  Social Connections:   . Frequency of Communication with Friends and Family:   . Frequency of Social Gatherings with Friends and Family:   . Attends Religious Services:   . Active Member of Clubs or Organizations:   . Attends Banker Meetings:   Marland Kitchen Marital Status:   Intimate Partner Violence:   . Fear of Current or Ex-Partner:   . Emotionally Abused:   Marland Kitchen Physically Abused:   . Sexually Abused:     Outpatient Medications Prior to Visit  Medication Sig Dispense Refill  . buPROPion (WELLBUTRIN XL) 150 MG 24 hr tablet Take 1 tablet (150 mg total) by mouth daily. 90 tablet  3  . cholecalciferol (VITAMIN D) 1000 UNITS tablet Take 1,000 Units by mouth daily.    . hydrOXYzine (VISTARIL) 25 MG capsule TAKE 1 CAPSULE (25 MG TOTAL) BY MOUTH 2 (TWO) TIMES DAILY. 180 capsule 0  . mirtazapine (REMERON) 30 MG tablet Take 1 tablet (30 mg total) by mouth at bedtime. 30 tablet 1  . nystatin ointment (MYCOSTATIN) Apply 1 application topically 2 (two) times daily. 30 g 1  . QUEtiapine (SEROQUEL) 50 MG tablet Take 1 tablet (50 mg total) by mouth daily for 30 days. (Patient taking differently: Take 50 mg by mouth in the morning and at bedtime. 50 mg qam, 100 mg q pm) 30 tablet 3  . hydrocortisone 2.5 % cream Apply topically 2 (two) times daily. (Patient not taking: Reported on 07/10/2019) 30 g 0  . hydrOXYzine (ATARAX/VISTARIL) 25 MG tablet TAKE 1 OR 2 TABLETS BY MOUTH AT BEDTIME AS NEEDED FOR AGITATION (Patient not taking: Reported on 07/10/2019) 180 tablet 3   No facility-administered medications prior to visit.    No Known Allergies  ROS Review of Systems  Constitutional: Positive for activity change. Negative for appetite change, fatigue and unexpected weight change.  HENT: Negative for drooling, ear pain, facial swelling, sinus pain and voice change.   Eyes: Negative for pain, discharge and redness.  Respiratory: Negative for cough, chest tightness and shortness of breath.   Cardiovascular: Negative for chest pain, palpitations and leg swelling.  Gastrointestinal: Negative for abdominal distention, abdominal pain, constipation and nausea.  Genitourinary: Negative for difficulty urinating, dyspareunia and pelvic pain.  Musculoskeletal: Negative for arthralgias, joint swelling and myalgias.  Skin: Negative for color change and rash.  Neurological: Negative for headaches.       Dementia   Psychiatric/Behavioral: The patient is not nervous/anxious.       Objective:    Physical Exam  Constitutional: She appears well-developed and well-nourished.  HENT:  Head:  Normocephalic.  Right Ear: External ear normal.  Left Ear: External ear normal.  Mouth/Throat: Oropharynx is clear and moist.  Eyes: Conjunctivae are normal. Right eye exhibits no discharge.  Cardiovascular: Normal rate, regular rhythm and normal heart sounds.  Pulmonary/Chest: Breath sounds normal. No respiratory distress. She exhibits no tenderness.  Abdominal: Bowel sounds are normal. There is no abdominal tenderness.  Musculoskeletal:        General: No tenderness. Normal range of motion.     Cervical back: Neck supple.  Neurological: She is alert.  Dementia  Skin: Skin is warm. No rash noted. No erythema.  Psychiatric: She has a normal mood and affect.    BP 102/64   Pulse 68   Temp (!) 97.5 F (36.4 C) (Temporal)   Resp 20   Ht 5\' 7"  (1.702 m)   Wt 166 lb 8 oz (75.5 kg)   SpO2 99%  BMI 26.08 kg/m  Wt Readings from Last 3 Encounters:  09/15/19 166 lb 8 oz (75.5 kg)  07/10/19 168 lb (76.2 kg)  01/06/19 169 lb (76.7 kg)       Lab Results  Component Value Date   TSH 2.030 07/01/2018   Lab Results  Component Value Date   WBC 4.1 07/10/2019   HGB 12.9 07/10/2019   HCT 39.6 07/10/2019   MCV 92 07/10/2019   PLT 198 07/10/2019   Lab Results  Component Value Date   NA 141 07/10/2019   K 4.2 07/10/2019   CO2 22 07/10/2019   GLUCOSE 90 07/10/2019   BUN 17 07/10/2019   CREATININE 0.96 07/10/2019   BILITOT 0.3 07/10/2019   ALKPHOS 96 07/10/2019   AST 33 07/10/2019   ALT 54 (H) 07/10/2019   PROT 6.6 07/10/2019   ALBUMIN 4.1 07/10/2019   CALCIUM 9.6 07/10/2019   Lab Results  Component Value Date   CHOL 184 07/10/2019   Lab Results  Component Value Date   HDL 55 07/10/2019   Lab Results  Component Value Date   LDLCALC 113 (H) 07/10/2019   Lab Results  Component Value Date   TRIG 87 07/10/2019   Lab Results  Component Value Date   CHOLHDL 3.3 07/10/2019   No results found for: HGBA1C    Assessment & Plan:   Problem List Items Addressed  This Visit      Other   Routine general medical examination at a health care facility    Annual wellness exam completed. Extensive preventative and health maintenance education provided. patient's caregiver wants colonoscopy and shingles vaccine done later. Mammogram was completed about a month ago per caregiver, lab work CMP, CBC and  LIPIDS  in march. Patient will follow up in 1  year       Other Visit Diagnoses    Visit for screening mammogram       Well woman exam with routine gynecological exam          No orders of the defined types were placed in this encounter.   Follow-up: Return in about 1 year (around 09/14/2020).    Daryll Drown, NP

## 2019-09-15 NOTE — Patient Instructions (Addendum)
Annual wellness exam completed. Extensive preventative and health maintenance education provided. patient's caregiver wants colonoscopy and shingles vaccine done later. Mammogram was completed about a month ago per caregiver, lab work CMP, CBC and  LIPIDS  in march. Patient will follow up in 1  year    Preventive Care 55-55 Years Old, Female Preventive care refers to visits with your health care provider and lifestyle choices that can promote health and wellness. This includes:  A yearly physical exam. This may also be called an annual well check.  Regular dental visits and eye exams.  Immunizations.  Screening for certain conditions.  Healthy lifestyle choices, such as eating a healthy diet, getting regular exercise, not using drugs or products that contain nicotine and tobacco, and limiting alcohol use. What can I expect for my preventive care visit? Physical exam Your health care provider will check your:  Height and weight. This may be used to calculate body mass index (BMI), which tells if you are at a healthy weight.  Heart rate and blood pressure.  Skin for abnormal spots. Counseling Your health care provider may ask you questions about your:  Alcohol, tobacco, and drug use.  Emotional well-being.  Home and relationship well-being.  Sexual activity.  Eating habits.  Work and work Statistician.  Method of birth control.  Menstrual cycle.  Pregnancy history. What immunizations do I need?  Influenza (flu) vaccine  This is recommended every year. Tetanus, diphtheria, and pertussis (Tdap) vaccine  You may need a Td booster every 10 years. Varicella (chickenpox) vaccine  You may need this if you have not been vaccinated. Zoster (shingles) vaccine  You may need this after age 55. Measles, mumps, and rubella (MMR) vaccine  You may need at least one dose of MMR if you were born in 1957 or later. You may also need a second dose. Pneumococcal conjugate (PCV13)  vaccine  You may need this if you have certain conditions and were not previously vaccinated. Pneumococcal polysaccharide (PPSV23) vaccine  You may need one or two doses if you smoke cigarettes or if you have certain conditions. Meningococcal conjugate (MenACWY) vaccine  You may need this if you have certain conditions. Hepatitis A vaccine  You may need this if you have certain conditions or if you travel or work in places where you may be exposed to hepatitis A. Hepatitis B vaccine  You may need this if you have certain conditions or if you travel or work in places where you may be exposed to hepatitis B. Haemophilus influenzae type b (Hib) vaccine  You may need this if you have certain conditions. Human papillomavirus (HPV) vaccine  If recommended by your health care provider, you may need three doses over 6 months. You may receive vaccines as individual doses or as more than one vaccine together in one shot (combination vaccines). Talk with your health care provider about the risks and benefits of combination vaccines. What tests do I need? Blood tests  Lipid and cholesterol levels. These may be checked every 5 years, or more frequently if you are over 55 years old.  Hepatitis C test.  Hepatitis B test. Screening  Lung cancer screening. You may have this screening every year starting at age 55 if you have a 30-pack-year history of smoking and currently smoke or have quit within the past 15 years.  Colorectal cancer screening. All adults should have this screening starting at age 55 and continuing until age 67. Your health care provider may recommend screening at  age 65 if you are at increased risk. You will have tests every 1-10 years, depending on your results and the type of screening test.  Diabetes screening. This is done by checking your blood sugar (glucose) after you have not eaten for a while (fasting). You may have this done every 1-3 years.  Mammogram. This may be  done every 1-2 years. Talk with your health care provider about when you should start having regular mammograms. This may depend on whether you have a family history of breast cancer.  BRCA-related cancer screening. This may be done if you have a family history of breast, ovarian, tubal, or peritoneal cancers.  Pelvic exam and Pap test. This may be done every 3 years starting at age 55. Starting at age 55, this may be done every 5 years if you have a Pap test in combination with an HPV test. Other tests  Sexually transmitted disease (STD) testing.  Bone density scan. This is done to screen for osteoporosis. You may have this scan if you are at high risk for osteoporosis. Follow these instructions at home: Eating and drinking  Eat a diet that includes fresh fruits and vegetables, whole grains, lean protein, and low-fat dairy.  Take vitamin and mineral supplements as recommended by your health care provider.  Do not drink alcohol if: ? Your health care provider tells you not to drink. ? You are pregnant, may be pregnant, or are planning to become pregnant.  If you drink alcohol: ? Limit how much you have to 0-1 drink a day. ? Be aware of how much alcohol is in your drink. In the [place] one drink equals one 12 oz bottle of beer (355 mL), one 5 oz glass of wine (148 mL), or one 1 oz glass of hard liquor (44 mL). Lifestyle  Take daily care of your teeth and gums.  Stay active. Exercise for at least 30 minutes on 5 or more days each week.  Do not use any products that contain nicotine or tobacco, such as cigarettes, e-cigarettes, and chewing tobacco. If you need help quitting, ask your health care provider.  If you are sexually active, practice safe sex. Use a condom or other form of birth control (contraception) in order to prevent pregnancy and STIs (sexually transmitted infections).  If told by your health care provider, take low-dose aspirin daily starting at age 27. What's  next?  Visit your health care provider once a year for a well check visit.  Ask your health care provider how often you should have your eyes and teeth checked.  Stay up to date on all vaccines. This information is not intended to replace advice given to you by your health care provider. Make sure you discuss any questions you have with your health care provider. Document Revised: 12/30/2017 Document Reviewed: 12/30/2017 Elsevier Patient Education  2020 [employer].

## 2019-09-29 DIAGNOSIS — F79 Unspecified intellectual disabilities: Secondary | ICD-10-CM | POA: Diagnosis not present

## 2019-09-29 DIAGNOSIS — F482 Pseudobulbar affect: Secondary | ICD-10-CM | POA: Diagnosis not present

## 2019-09-29 DIAGNOSIS — F4312 Post-traumatic stress disorder, chronic: Secondary | ICD-10-CM | POA: Diagnosis not present

## 2019-09-29 DIAGNOSIS — G3109 Other frontotemporal dementia: Secondary | ICD-10-CM | POA: Diagnosis not present

## 2019-10-17 ENCOUNTER — Other Ambulatory Visit: Payer: Self-pay | Admitting: *Deleted

## 2019-10-17 DIAGNOSIS — N390 Urinary tract infection, site not specified: Secondary | ICD-10-CM

## 2019-10-18 ENCOUNTER — Other Ambulatory Visit: Payer: Self-pay | Admitting: Family Medicine

## 2019-10-18 DIAGNOSIS — N390 Urinary tract infection, site not specified: Secondary | ICD-10-CM

## 2019-10-18 NOTE — Telephone Encounter (Signed)
Please advise, this is to prevent UTI's she has been on it for years

## 2019-10-18 NOTE — Telephone Encounter (Signed)
  Prescription Request  10/18/2019  What is the name of the medication or equipment? Nitrofurantoin Monohyd Macro 100mg  was denied but husband states she takes this everyday to prevent UTI's for years  Have you contacted your pharmacy to request a refill? (if applicable) YES  Which pharmacy would you like this sent to? [facility] in   Patient notified that their request is being sent to the clinical staff for review and that they should receive a response within 2 business days.

## 2019-10-19 ENCOUNTER — Telehealth: Payer: Self-pay | Admitting: Family Medicine

## 2019-10-19 MED ORDER — NITROFURANTOIN MONOHYD MACRO 100 MG PO CAPS: 100.0000 mg | ORAL_CAPSULE | Freq: Every day | ORAL | 1 refills | Status: DC

## 2019-10-19 NOTE — Telephone Encounter (Signed)
Medication refilled

## 2019-10-19 NOTE — Telephone Encounter (Signed)
LMOVM refill has been sent to pharmacy 

## 2019-10-19 NOTE — Telephone Encounter (Signed)
Patients husband is asking for refill on NITROFURANTOIN MONO-MCR 100 MG was [facility] in [facility]. Has been taking off med list need to resend rx. Patient husband states she takes it once a day. Please advise if we can resend in?

## 2019-10-19 NOTE — Telephone Encounter (Signed)
Addressed in previous telephone note 

## 2019-11-07 ENCOUNTER — Other Ambulatory Visit: Payer: Self-pay

## 2019-11-07 ENCOUNTER — Other Ambulatory Visit: Payer: BC Managed Care – PPO

## 2019-11-07 DIAGNOSIS — Z136 Encounter for screening for cardiovascular disorders: Secondary | ICD-10-CM | POA: Diagnosis not present

## 2019-11-07 DIAGNOSIS — F02818 Dementia in other diseases classified elsewhere, unspecified severity, with other behavioral disturbance: Secondary | ICD-10-CM

## 2019-11-07 DIAGNOSIS — F0281 Dementia in other diseases classified elsewhere with behavioral disturbance: Secondary | ICD-10-CM

## 2019-11-08 LAB — CMP14+EGFR
ALT: 56 IU/L — ABNORMAL HIGH (ref 0–32)
AST: 35 IU/L (ref 0–40)
Albumin/Globulin Ratio: 1.9 (ref 1.2–2.2)
Albumin: 4.3 g/dL (ref 3.8–4.9)
Alkaline Phosphatase: 101 IU/L (ref 48–121)
BUN/Creatinine Ratio: 20 (ref 9–23)
BUN: 17 mg/dL (ref 6–24)
Bilirubin Total: 0.3 mg/dL (ref 0.0–1.2)
CO2: 24 mmol/L (ref 20–29)
Calcium: 9.4 mg/dL (ref 8.7–10.2)
Chloride: 107 mmol/L — ABNORMAL HIGH (ref 96–106)
Creatinine, Ser: 0.87 mg/dL (ref 0.57–1.00)
GFR calc Af Amer: 87 mL/min/{1.73_m2} (ref >59)
GFR calc non Af Amer: 76 mL/min/{1.73_m2} (ref >59)
Globulin, Total: 2.3 g/dL (ref 1.5–4.5)
Glucose: 88 mg/dL (ref 65–99)
Potassium: 4.5 mmol/L (ref 3.5–5.2)
Sodium: 141 mmol/L (ref 134–144)
Total Protein: 6.6 g/dL (ref 6.0–8.5)

## 2019-11-08 LAB — CBC WITH DIFFERENTIAL/PLATELET
Basophils Absolute: 0.1 10*3/uL (ref 0.0–0.2)
Basos: 1 %
EOS (ABSOLUTE): 0.1 10*3/uL (ref 0.0–0.4)
Eos: 2 %
Hematocrit: 39.3 % (ref 34.0–46.6)
Hemoglobin: 12.8 g/dL (ref 11.1–15.9)
Immature Grans (Abs): 0 10*3/uL (ref 0.0–0.1)
Immature Granulocytes: 0 %
Lymphocytes Absolute: 1.7 10*3/uL (ref 0.7–3.1)
Lymphs: 41 %
MCH: 30.8 pg (ref 26.6–33.0)
MCHC: 32.6 g/dL (ref 31.5–35.7)
MCV: 95 fL (ref 79–97)
Monocytes Absolute: 0.3 10*3/uL (ref 0.1–0.9)
Monocytes: 8 %
Neutrophils Absolute: 2 10*3/uL (ref 1.4–7.0)
Neutrophils: 48 %
Platelets: 168 10*3/uL (ref 150–450)
RBC: 4.16 x10E6/uL (ref 3.77–5.28)
RDW: 12.4 % (ref 11.7–15.4)
WBC: 4.2 10*3/uL (ref 3.4–10.8)

## 2019-11-08 LAB — LIPID PANEL
Chol/HDL Ratio: 3.5 ratio (ref 0.0–4.4)
Cholesterol, Total: 200 mg/dL — ABNORMAL HIGH (ref 100–199)
HDL: 57 mg/dL (ref >39)
LDL Chol Calc (NIH): 127 mg/dL — ABNORMAL HIGH (ref 0–99)
Triglycerides: 87 mg/dL (ref 0–149)
VLDL Cholesterol Cal: 16 mg/dL (ref 5–40)

## 2019-11-09 ENCOUNTER — Other Ambulatory Visit: Payer: Self-pay

## 2019-11-09 ENCOUNTER — Encounter: Payer: Self-pay | Admitting: Family Medicine

## 2019-11-09 ENCOUNTER — Ambulatory Visit: Payer: BC Managed Care – PPO | Admitting: Family Medicine

## 2019-11-09 VITALS — BP 105/70 | HR 61 | Temp 97.6°F | Ht 67.0 in | Wt 169.0 lb

## 2019-11-09 DIAGNOSIS — R3981 Functional urinary incontinence: Secondary | ICD-10-CM

## 2019-11-09 DIAGNOSIS — F0281 Dementia in other diseases classified elsewhere with behavioral disturbance: Secondary | ICD-10-CM

## 2019-11-09 DIAGNOSIS — N390 Urinary tract infection, site not specified: Secondary | ICD-10-CM | POA: Diagnosis not present

## 2019-11-09 DIAGNOSIS — F02818 Dementia in other diseases classified elsewhere, unspecified severity, with other behavioral disturbance: Secondary | ICD-10-CM

## 2019-11-09 NOTE — Progress Notes (Signed)
Assessment & Plan:  1. Dementia due to medical condition with behavioral disturbance (HCC) - Well controlled on current regimen. Husband is her caregiver. Sees psychiatrist.   2. Frequent UTI - Preventive therapy with Macrobid QHS.   3. Urinary incontinence due to cognitive impairment - Encouraged frequent changes of pull-ups and good hygiene.    Return in about 3 months (around 02/09/2020) for follow-up of chronic medication conditions.  Courtney Boston, MSN, APRN, FNP-C [facility]  Subjective:    Patient ID: Courtney Allen, female    DOB: [birth year], 54 y.o.   MRN: [id number]  Patient Care Team: Courtney Fudge, FNP as PCP - General (Family Medicine) Courtney Bali, MD (Inactive) as Consulting Physician (Gastroenterology)   Chief Complaint:  Chief Complaint  Patient presents with  . Establish Care    [facility]akes pt  . Dementia    3 month follow up of chronic medical conditions    HPI: Courtney Allen is a 54 y.o. female presenting on 11/09/2019 for Establish Care (Rakes pt) and Dementia (3 month follow up of chronic medical conditions)  Patient is accompanied by her husband, who is her primary caregiver.  Patient's husband reports her psychiatrist prescribes her Wellbutrin, hydroxyzine, mirtazapine, and Seroquel.  Patient takes Macrobid 100 mg at bedtime for UTI prophylaxis.  Patient's LDL level is slightly elevated on her lab work.  ASCVD risk score of 1.2%.  New complaints: None  Social history:  Relevant past medical, surgical, family and social history reviewed and updated as indicated. Interim medical history since our last visit reviewed.  Allergies and medications reviewed and updated.  DATA REVIEWED: CHART IN EPIC  ROS: Negative unless specifically indicated above in HPI.    Current Outpatient Medications:  .  buPROPion (WELLBUTRIN XL) 150 MG 24 hr tablet, Take 1 tablet (150 mg total) by mouth daily., Disp: 90 tablet, Rfl:  3 .  cholecalciferol (VITAMIN D) 1000 UNITS tablet, Take 1,000 Units by mouth daily., Disp: , Rfl:  .  hydrOXYzine (ATARAX/VISTARIL) 25 MG tablet, TAKE 1 OR 2 TABLETS BY MOUTH AT BEDTIME AS NEEDED FOR AGITATION, Disp: 180 tablet, Rfl: 3 .  mirtazapine (REMERON) 30 MG tablet, Take 1 tablet (30 mg total) by mouth at bedtime., Disp: 30 tablet, Rfl: 1 .  nitrofurantoin, macrocrystal-monohydrate, (MACROBID) 100 MG capsule, Take 1 capsule (100 mg total) by mouth at bedtime., Disp: 90 capsule, Rfl: 1 .  QUEtiapine (SEROQUEL) 50 MG tablet, Take 1 tablet (50 mg total) by mouth daily for 30 days. (Patient taking differently: Take 50 mg by mouth in the morning and at bedtime. 50 mg qam, 100 mg q pm), Disp: 30 tablet, Rfl: 3   No Known Allergies Past Medical History:  Diagnosis Date  . Bipolar I disorder, most recent episode (or current) unspecified   . Dementia (HCC)   . 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   . Depression   . Developmental delay   . Fetal alcohol syndrome 01/18/2013  . General learning disability 01/18/2013  . Memory loss   . Mental retardation, moderate (I.Q. 35-49) 01/18/2013  . Retinitis pigmentosa, both eyes 01/18/2013  . Urinary incontinence due to cognitive impairment 01/18/2013    Past Surgical History:  Procedure Laterality Date  . CESAREAN SECTION    . TONSILLECTOMY    . TUBAL LIGATION      Social History   Socioeconomic History  . Marital status:  Married    Spouse name: Courtney Allen  . Number of children: 3  . Years of education: 10  . Highest education level: Not on file  Occupational History  . Occupation: disabled  Tobacco Use  . Smoking status: Never Smoker  . Smokeless tobacco: Never Used  Substance and Sexual Activity  . Alcohol use: No  . Drug use: No  . Sexual activity: Not on file  Other Topics Concern  . Not on file  Social  History Narrative  . Not on file   Social Determinants of Health   Financial Resource Strain:   . Difficulty of Paying Living Expenses:   Food Insecurity:   . Worried About Programme researcher, broadcasting/film/video in the Last Year:   . Barista in the Last Year:   Transportation Needs:   . Freight forwarder (Medical):   Marland Kitchen Lack of Transportation (Non-Medical):   Physical Activity:   . Days of Exercise per Week:   . Minutes of Exercise per Session:   Stress:   . Feeling of Stress :   Social Connections:   . Frequency of Communication with Friends and Family:   . Frequency of Social Gatherings with Friends and Family:   . Attends Religious Services:   . Active Member of Clubs or Organizations:   . Attends Banker Meetings:   Marland Kitchen Marital Status:   Intimate Partner Violence:   . Fear of Current or Ex-Partner:   . Emotionally Abused:   Marland Kitchen Physically Abused:   . Sexually Abused:         Objective:    BP 105/70   Pulse 61   Temp 97.6 F (36.4 C) (Temporal)   Ht 5\' 7"  (1.702 m)   Wt 169 lb (76.7 kg)   BMI 26.47 kg/m   Wt Readings from Last 3 Encounters:  11/09/19 169 lb (76.7 kg)  09/15/19 166 lb 8 oz (75.5 kg)  07/10/19 168 lb (76.2 kg)    Physical Exam Vitals reviewed.  Constitutional:      General: She is not in acute distress.    Appearance: Normal appearance. She is overweight. She is not ill-appearing, toxic-appearing or diaphoretic.  HENT:     Head: Normocephalic and atraumatic.  Eyes:     General: No scleral icterus.       Right eye: No discharge.        Left eye: No discharge.     Conjunctiva/sclera: Conjunctivae normal.  Cardiovascular:     Rate and Rhythm: Normal rate and regular rhythm.     Heart sounds: Normal heart sounds. No murmur heard.  No friction rub. No gallop.   Pulmonary:     Effort: Pulmonary effort is normal. No respiratory distress.     Breath sounds: Normal breath sounds. No stridor. No wheezing, rhonchi or rales.   Musculoskeletal:        General: Normal range of motion.     Cervical back: Normal range of motion.  Skin:    General: Skin is warm and dry.     Capillary Refill: Capillary refill takes less than 2 seconds.  Neurological:     General: No focal deficit present.     Mental Status: She is alert and oriented to person, place, and time. Mental status is at baseline.  Psychiatric:        Attention and Perception: She is inattentive.        Mood and Affect: Mood is anxious.  Behavior: Behavior is cooperative.        Cognition and Memory: Cognition is impaired. She exhibits impaired recent memory.     Lab Results  Component Value Date   TSH 2.030 07/01/2018   Lab Results  Component Value Date   WBC 4.2 11/07/2019   HGB 12.8 11/07/2019   HCT 39.3 11/07/2019   MCV 95 11/07/2019   PLT 168 11/07/2019   Lab Results  Component Value Date   NA 141 11/07/2019   K 4.5 11/07/2019   CO2 24 11/07/2019   GLUCOSE 88 11/07/2019   BUN 17 11/07/2019   CREATININE 0.87 11/07/2019   BILITOT 0.3 11/07/2019   ALKPHOS 101 11/07/2019   AST 35 11/07/2019   ALT 56 (H) 11/07/2019   PROT 6.6 11/07/2019   ALBUMIN 4.3 11/07/2019   CALCIUM 9.4 11/07/2019   Lab Results  Component Value Date   CHOL 200 (H) 11/07/2019   Lab Results  Component Value Date   HDL 57 11/07/2019   Lab Results  Component Value Date   LDLCALC 127 (H) 11/07/2019   Lab Results  Component Value Date   TRIG 87 11/07/2019   Lab Results  Component Value Date   CHOLHDL 3.5 11/07/2019   No results found for: HGBA1C

## 2020-01-19 DIAGNOSIS — F0281 Dementia in other diseases classified elsewhere with behavioral disturbance: Secondary | ICD-10-CM | POA: Diagnosis not present

## 2020-01-19 DIAGNOSIS — G3109 Other frontotemporal dementia: Secondary | ICD-10-CM | POA: Diagnosis not present

## 2020-02-27 ENCOUNTER — Ambulatory Visit (INDEPENDENT_AMBULATORY_CARE_PROVIDER_SITE_OTHER): Payer: BC Managed Care – PPO

## 2020-02-27 ENCOUNTER — Ambulatory Visit: Payer: BC Managed Care – PPO | Admitting: Family Medicine

## 2020-02-27 ENCOUNTER — Other Ambulatory Visit: Payer: Self-pay

## 2020-02-27 DIAGNOSIS — Z23 Encounter for immunization: Secondary | ICD-10-CM

## 2020-03-13 ENCOUNTER — Telehealth: Payer: Self-pay | Admitting: Family Medicine

## 2020-03-13 NOTE — Telephone Encounter (Signed)
Okay to place order as long as patient understands that she may be responsible for costs and that testing is totally elective.  There is not a clear indication for Covid antibody testing at this time as there is been no established level of antibodies to indicate sufficient immunity, need for vaccination etc.

## 2020-03-13 NOTE — Telephone Encounter (Signed)
Spoke with patients husband.  Husband states that he contacted insurance company and they would cover antibody testing.  Order placed.

## 2020-03-13 NOTE — Telephone Encounter (Signed)
Okay to order COVID antibodies ? 

## 2020-03-13 NOTE — Telephone Encounter (Signed)
Pt is requesting an antibody test

## 2020-03-15 ENCOUNTER — Other Ambulatory Visit: Payer: Self-pay | Admitting: Family Medicine

## 2020-03-15 ENCOUNTER — Other Ambulatory Visit: Payer: Self-pay

## 2020-03-15 ENCOUNTER — Other Ambulatory Visit: Payer: BC Managed Care – PPO

## 2020-03-15 DIAGNOSIS — Z789 Other specified health status: Secondary | ICD-10-CM

## 2020-03-16 LAB — SARS-COV-2 SEMI-QUANTITATIVE TOTAL ANTIBODY, SPIKE
SARS-CoV-2 Semi-Quant Total Ab: 115.4 U/mL (ref <0.8)
SARS-CoV-2 Spike Ab Interp: POSITIVE

## 2020-03-17 ENCOUNTER — Other Ambulatory Visit: Payer: Self-pay | Admitting: Family Medicine

## 2020-03-17 DIAGNOSIS — N390 Urinary tract infection, site not specified: Secondary | ICD-10-CM

## 2020-03-20 ENCOUNTER — Telehealth: Payer: Self-pay

## 2020-03-20 DIAGNOSIS — N390 Urinary tract infection, site not specified: Secondary | ICD-10-CM

## 2020-03-20 NOTE — Telephone Encounter (Signed)
Patient aware.

## 2020-03-20 NOTE — Telephone Encounter (Signed)
PA received Nitrofurantion Monohyd Macro 100mg   Key- [id number]  TO DO ON 03/22/20- WHEN MEDICATION EXPIRED

## 2020-03-22 NOTE — Telephone Encounter (Signed)
PA already on file for this medication. PA not needed at this time.

## 2020-04-21 ENCOUNTER — Other Ambulatory Visit: Payer: Self-pay | Admitting: Family Medicine

## 2020-04-21 DIAGNOSIS — N390 Urinary tract infection, site not specified: Secondary | ICD-10-CM

## 2020-04-22 ENCOUNTER — Other Ambulatory Visit: Payer: Self-pay | Admitting: Family Medicine

## 2020-04-22 ENCOUNTER — Telehealth: Payer: Self-pay

## 2020-04-22 DIAGNOSIS — Z87440 Personal history of urinary (tract) infections: Secondary | ICD-10-CM

## 2020-04-22 DIAGNOSIS — N390 Urinary tract infection, site not specified: Secondary | ICD-10-CM

## 2020-04-22 MED ORDER — NITROFURANTOIN MONOHYD MACRO 100 MG PO CAPS
100.0000 mg | ORAL_CAPSULE | Freq: Every day | ORAL | 0 refills | Status: DC
Start: 2020-04-22 — End: 2020-05-01

## 2020-04-22 NOTE — Telephone Encounter (Signed)
Sent refill for patient's Macrobid

## 2020-04-22 NOTE — Telephone Encounter (Signed)
Preventative therapy nightly per Courtney Allen's last notes

## 2020-04-22 NOTE — Telephone Encounter (Signed)
  Prescription Request  04/22/2020  What is the name of the medication or equipment? Macrobid (Nitrofurantoin)  Have you contacted your pharmacy to request a refill? (if applicable) yes  Which pharmacy would you like this sent to? [facility]   Patient notified that their request is being sent to the clinical staff for review and that they should receive a response within 2 business days.   Joyce's pt.  Pt has an appt in December. She has dementia & she only has 2 days left.

## 2020-04-23 NOTE — Telephone Encounter (Signed)
Pharmacy comment:  Alternative Requested: NEEDS A PRIOR AUTHORIZATION FOR QTY GREATER THAN 14.

## 2020-04-24 ENCOUNTER — Other Ambulatory Visit: Payer: Self-pay

## 2020-04-24 DIAGNOSIS — Z87440 Personal history of urinary (tract) infections: Secondary | ICD-10-CM | POA: Insufficient documentation

## 2020-04-24 DIAGNOSIS — N390 Urinary tract infection, site not specified: Secondary | ICD-10-CM

## 2020-04-24 NOTE — Telephone Encounter (Signed)
PA REFERENCE #[id number] 5 CALENDAR DAYS  Clinical questions verbally answered with representative [employer] - 1 [phone]

## 2020-04-24 NOTE — Telephone Encounter (Signed)
Pt calling about PA on macrobid rx. Please call back today

## 2020-05-01 ENCOUNTER — Other Ambulatory Visit: Payer: Self-pay | Admitting: Family Medicine

## 2020-05-01 ENCOUNTER — Encounter: Payer: Self-pay | Admitting: Family Medicine

## 2020-05-01 ENCOUNTER — Ambulatory Visit: Payer: BC Managed Care – PPO | Admitting: Family Medicine

## 2020-05-01 ENCOUNTER — Other Ambulatory Visit: Payer: Self-pay

## 2020-05-01 VITALS — BP 99/58 | HR 59 | Temp 97.9°F | Ht 67.0 in | Wt 173.1 lb

## 2020-05-01 DIAGNOSIS — F0281 Dementia in other diseases classified elsewhere with behavioral disturbance: Secondary | ICD-10-CM | POA: Diagnosis not present

## 2020-05-01 DIAGNOSIS — R3981 Functional urinary incontinence: Secondary | ICD-10-CM

## 2020-05-01 DIAGNOSIS — N390 Urinary tract infection, site not specified: Secondary | ICD-10-CM | POA: Diagnosis not present

## 2020-05-01 DIAGNOSIS — F02818 Dementia in other diseases classified elsewhere, unspecified severity, with other behavioral disturbance: Secondary | ICD-10-CM

## 2020-05-01 MED ORDER — NITROFURANTOIN MONOHYD MACRO 100 MG PO CAPS
100.0000 mg | ORAL_CAPSULE | Freq: Every day | ORAL | 0 refills | Status: DC
Start: 2020-05-01 — End: 2020-07-16

## 2020-05-01 NOTE — Addendum Note (Signed)
Addended by: Julious Payer D on: 05/01/2020 02:45 PM   Modules accepted: Orders

## 2020-05-01 NOTE — Telephone Encounter (Signed)
Refill failed. resent °

## 2020-05-01 NOTE — Progress Notes (Signed)
Subjective: CC: recurrent UTI PCP: Gwenlyn Fudge, FNP  Courtney Allen Ton is a 54 y.o. female presenting to clinic today for:  1. Recurrent UTI Courtney Allen has been taking macrobid nightly for prophylaxis for recurrent UTIs. She has been taking this for 5 years and has not had a UTI since starting on it. She was getting UTIs multiples times a year before starting on macrobid for prophylaxis. She has dementia and is incontinent. She wears pull ups due to incontinence. She denies any current urinary symptoms. She needs a refill on macrobid.   Relevant past medical, surgical, family, and social history reviewed and updated as indicated.  Allergies and medications reviewed and updated.  No Known Allergies Past Medical History:  Diagnosis Date  . Bipolar I disorder, most recent episode (or current) unspecified   . Dementia (HCC)   . 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   . Depression   . Developmental delay   . Fetal alcohol syndrome 01/18/2013  . General learning disability 01/18/2013  . Memory loss   . Mental retardation, moderate (I.Q. 35-49) 01/18/2013  . Retinitis pigmentosa, both eyes 01/18/2013  . Urinary incontinence due to cognitive impairment 01/18/2013    Current Outpatient Medications:  .  buPROPion (WELLBUTRIN XL) 150 MG 24 hr tablet, Take 1 tablet (150 mg total) by mouth daily., Disp: 90 tablet, Rfl: 3 .  cholecalciferol (VITAMIN D) 1000 UNITS tablet, Take 1,000 Units by mouth daily., Disp: , Rfl:  .  hydrOXYzine (ATARAX/VISTARIL) 25 MG tablet, Take 25 mg by mouth 2 (two) times daily., Disp: , Rfl:  .  LORazepam (ATIVAN) 1 MG tablet, Take 1 mg by mouth daily as needed., Disp: , Rfl:  .  mirtazapine (REMERON) 30 MG tablet, Take 1 tablet (30 mg total) by mouth at bedtime., Disp: 30 tablet, Rfl: 1 .  nitrofurantoin, macrocrystal-monohydrate,  (MACROBID) 100 MG capsule, Take 1 capsule (100 mg total) by mouth at bedtime., Disp: 90 capsule, Rfl: 0 .  QUEtiapine (SEROQUEL) 50 MG tablet, Take 50 mg PO in the AM and 100 mg QHS., Disp: , Rfl:  Social History   Socioeconomic History  . Marital status: Married    Spouse name: Jonny Ruiz  . Number of children: 3  . Years of education: 31  . Highest education level: Not on file  Occupational History  . Occupation: disabled  Tobacco Use  . Smoking status: Never Smoker  . Smokeless tobacco: Never Used  Substance and Sexual Activity  . Alcohol use: No  . Drug use: No  . Sexual activity: Not on file  Other Topics Concern  . Not on file  Social History Narrative  . Not on file   Social Determinants of Health   Financial Resource Strain: Not on file  Food Insecurity: Not on file  Transportation Needs: Not on file  Physical Activity: Not on file  Stress: Not on file  Social Connections: Not on file  Intimate Partner Violence: Not on file   Family History  Problem Relation Age of Onset  . Alcohol abuse Mother   . Heart attack Mother 27  . Diabetes Mother   . Cancer Mother        Deceased in mid 41s, either colon cancer or pancreatic cancer reported per patient's spouse  . Emphysema Brother   . Diabetes Brother     Review of Systems  Negative unless specially indicated above in HPI.  Objective: Office vital signs reviewed. BP (!) 99/58   Pulse (!) 59   Temp 97.9 F (36.6 C) (Temporal)   Ht 5\' 7"  (1.702 m)   Wt 173 lb 2 oz (78.5 kg)   BMI 27.12 kg/m   Physical Examination:  Physical Exam Vitals reviewed.  Constitutional:      General: She is not in acute distress.    Appearance: Normal appearance. She is overweight. She is not ill-appearing, toxic-appearing or diaphoretic.  HENT:     Head: Normocephalic and atraumatic.  Eyes:     General: No scleral icterus.       Right eye: No discharge.        Left eye: No discharge.     Conjunctiva/sclera: Conjunctivae  normal.  Cardiovascular:     Rate and Rhythm: Normal rate and regular rhythm.     Heart sounds: Normal heart sounds. No murmur heard. No friction rub. No gallop.   Pulmonary:     Effort: Pulmonary effort is normal. No respiratory distress.     Breath sounds: Normal breath sounds. No stridor. No wheezing, rhonchi or rales.  Musculoskeletal:        General: Normal range of motion.     Cervical back: Normal range of motion.  Skin:    General: Skin is warm and dry.     Capillary Refill: Capillary refill takes less than 2 seconds.  Neurological:     Mental Status: She is alert and oriented to person, place, and time. Mental status is at baseline.  Psychiatric:        Attention and Perception: She is inattentive.        Mood and Affect: Mood is anxious.        Behavior: Behavior is cooperative.        Cognition and Memory: Cognition is impaired. She exhibits impaired recent memory.     Results for orders placed or performed in visit on 03/15/20  SARS-CoV-2 Semi-Quantitative Total Antibody, Spike  Result Value Ref Range   SARS-CoV-2 Semi-Quant Total Ab 115.4 Negative<0.8 U/mL   SARS-CoV-2 Spike Ab Interp Positive      Assessment/ Plan: Shykeria was seen today for recurrent uti.  Diagnoses and all orders for this visit:  Frequent UTI Urinary incontinence due to cognitive impairment Dementia due to medical condition with behavioral disturbance (HCC) Refill ordered for Macrobid 100 mg qhs for UTI prophylaxis due to recurrent UTIs from urinary incontinence due to cognitive impairment.   Follow up as scheduled with PCP.   The above assessment and management plan was discussed with the patient. The patient verbalized understanding of and has agreed to the management plan. Patient is aware to call the clinic if symptoms persist or worsen. Patient is aware when to return to the clinic for a follow-up visit. Patient educated on when it is appropriate to go to the emergency department.    Synetta Fail, FNP-C [facility] [address] [place], [place] [phone]

## 2020-05-08 ENCOUNTER — Telehealth: Payer: Self-pay | Admitting: *Deleted

## 2020-05-08 ENCOUNTER — Telehealth: Payer: Self-pay

## 2020-05-08 NOTE — Telephone Encounter (Signed)
Spouse says that nitrofurantoin, macrocrystal-monohydrate, (MACROBID) 100 MG capsule is not at pharmacy. It says a qt of 90 was sent in but was only able to get 14 tablets. Please call back

## 2020-05-08 NOTE — Telephone Encounter (Signed)
PA to be started today - separate encounter

## 2020-05-08 NOTE — Telephone Encounter (Signed)
Available without authorization. - per cover my meds  [facility]: Aware and went through

## 2020-05-08 NOTE — Telephone Encounter (Signed)
Key: [id number] Nitrofurantoin Monohyd Macro 100MG  capsules Awaiting clinical question response

## 2020-05-08 NOTE — Telephone Encounter (Signed)
Have we received a PA on this medication ?

## 2020-05-31 DIAGNOSIS — F0281 Dementia in other diseases classified elsewhere with behavioral disturbance: Secondary | ICD-10-CM | POA: Diagnosis not present

## 2020-06-26 ENCOUNTER — Telehealth: Payer: Self-pay | Admitting: Family Medicine

## 2020-06-26 DIAGNOSIS — E782 Mixed hyperlipidemia: Secondary | ICD-10-CM

## 2020-06-26 NOTE — Telephone Encounter (Signed)
Pt would like to have orders put in for labs before her appt

## 2020-06-27 NOTE — Telephone Encounter (Signed)
Labs ordered for the week prior to her appointment.

## 2020-06-28 NOTE — Telephone Encounter (Signed)
Patient aware and verbalized understanding. °

## 2020-07-16 ENCOUNTER — Ambulatory Visit (INDEPENDENT_AMBULATORY_CARE_PROVIDER_SITE_OTHER): Payer: BC Managed Care – PPO | Admitting: Family Medicine

## 2020-07-16 ENCOUNTER — Other Ambulatory Visit: Payer: Self-pay

## 2020-07-16 ENCOUNTER — Encounter: Payer: Self-pay | Admitting: Family Medicine

## 2020-07-16 VITALS — BP 103/68 | HR 59 | Temp 97.2°F | Ht 67.0 in | Wt 178.0 lb

## 2020-07-16 DIAGNOSIS — E782 Mixed hyperlipidemia: Secondary | ICD-10-CM

## 2020-07-16 DIAGNOSIS — Z0001 Encounter for general adult medical examination with abnormal findings: Secondary | ICD-10-CM

## 2020-07-16 DIAGNOSIS — Z0000 Encounter for general adult medical examination without abnormal findings: Secondary | ICD-10-CM

## 2020-07-16 NOTE — Progress Notes (Signed)
Assessment & Plan:  1. Well adult exam Preventive health education provided. Husband is going to schedule her an appointment at his gastroenterologist for a colonoscopy. Declined COVID-19 and Shingrix immunizations.    Follow-up: Return in about 1 year (around 07/16/2021) for annual physical.   Deliah Boston, MSN, APRN, FNP-C [facility]  Subjective:  Patient ID: Courtney Allen, female    DOB: [birth year]  Age: 55 y.o. MRN: [id number]  Patient Care Team: Gwenlyn Fudge, FNP as PCP - General (Family Medicine) [place] Bali, MD (Inactive) as Consulting Physician (Gastroenterology)   CC:  Chief Complaint  Patient presents with  . Annual Exam    HPI Courtney Allen presents for her annual physical. She is accompanied by her husband who is her caregiver.   Occupation: Unemployed, Marital status: Married, Substance use: None Last colonoscopy: Never Last mammogram: 08/08/2019 Last pap smear: 07/01/2018 Hepatitis C Screening: 07/29/2015 Immunizations: Flu Vaccine: up to date Tdap Vaccine: up to date  Shingrix Vaccine: declined  COVID-19 Vaccine: declined  DEPRESSION SCREENING PHQ 2/9 Scores 11/09/2019 09/15/2019 01/06/2019 10/07/2018 07/01/2018 05/13/2018 03/28/2018  PHQ - 2 Score 0 0 0 - 0 0 0  Exception Documentation - - - Medical reason - - -     Review of Systems  Unable to perform ROS: Dementia     Current Outpatient Medications:  .  buPROPion (WELLBUTRIN XL) 150 MG 24 hr tablet, Take 1 tablet (150 mg total) by mouth daily., Disp: 90 tablet, Rfl: 3 .  cholecalciferol (VITAMIN D) 1000 UNITS tablet, Take 1,000 Units by mouth daily., Disp: , Rfl:  .  hydrOXYzine (ATARAX/VISTARIL) 25 MG tablet, Take 25 mg by mouth 2 (two) times daily., Disp: , Rfl:  .  LORazepam (ATIVAN) 1 MG tablet, Take 1 mg by mouth daily as needed., Disp: , Rfl:  .  mirtazapine (REMERON) 30 MG tablet, Take 1 tablet (30 mg total) by mouth at bedtime., Disp: 30 tablet, Rfl: 1 .   nitrofurantoin (MACRODANTIN) 100 MG capsule, Take 100 mg by mouth daily at 2 PM., Disp: , Rfl:  .  QUEtiapine (SEROQUEL) 50 MG tablet, Take 50 mg PO in the AM and 100 mg QHS., Disp: , Rfl:   No Known Allergies  Past Medical History:  Diagnosis Date  . Bipolar I disorder, most recent episode (or current) unspecified   . 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   . Depression   . Developmental delay   . Fetal alcohol syndrome 01/18/2013  . General learning disability 01/18/2013  . Memory loss   . Mental retardation, moderate (I.Q. 35-49) 01/18/2013  . Retinitis pigmentosa, both eyes 01/18/2013  . Urinary incontinence due to cognitive impairment 01/18/2013    Past Surgical History:  Procedure Laterality Date  . CESAREAN SECTION    . TONSILLECTOMY    . TUBAL LIGATION      Family History  Problem Relation Age of Onset  . Alcohol abuse Mother   . Heart attack Mother 33  . Diabetes Mother   . Cancer Mother        Deceased in mid 28s, either colon cancer or pancreatic cancer reported per patient's spouse  . Emphysema Brother   . Diabetes Brother     Social History   Socioeconomic History  . Marital status: Married    Spouse name: Jonny Ruiz  . Number of children: 3  . Years of education: 71  .  Highest education level: Not on file  Occupational History  . Occupation: disabled  Tobacco Use  . Smoking status: Never Smoker  . Smokeless tobacco: Never Used  Substance and Sexual Activity  . Alcohol use: No  . Drug use: No  . Sexual activity: Not on file  Other Topics Concern  . Not on file  Social History Narrative  . Not on file   Social Determinants of Health   Financial Resource Strain: Not on file  Food Insecurity: Not on file  Transportation Needs: Not on file  Physical Activity: Not on file  Stress: Not on file  Social Connections: Not on file   Intimate Partner Violence: Not on file      Objective:    BP 103/68   Pulse (!) 59   Temp (!) 97.2 F (36.2 C) (Temporal)   Ht 5\' 7"  (1.702 m)   Wt 178 lb (80.7 kg)   BMI 27.88 kg/m   Wt Readings from Last 3 Encounters:  07/16/20 178 lb (80.7 kg)  05/01/20 173 lb 2 oz (78.5 kg)  11/09/19 169 lb (76.7 kg)    Physical Exam Vitals reviewed.  Constitutional:      General: She is not in acute distress.    Appearance: Normal appearance. She is overweight. She is not ill-appearing, toxic-appearing or diaphoretic.  HENT:     Head: Normocephalic and atraumatic.     Right Ear: Tympanic membrane, ear canal and external ear normal. There is no impacted cerumen.     Left Ear: Tympanic membrane, ear canal and external ear normal. There is no impacted cerumen.     Nose: Nose normal. No congestion or rhinorrhea.     Mouth/Throat:     Mouth: Mucous membranes are moist.     Pharynx: Oropharynx is clear. No oropharyngeal exudate or posterior oropharyngeal erythema.  Eyes:     General: No scleral icterus.       Right eye: No discharge.        Left eye: No discharge.     Conjunctiva/sclera: Conjunctivae normal.     Pupils: Pupils are equal, round, and reactive to light.  Cardiovascular:     Rate and Rhythm: Normal rate and regular rhythm.     Heart sounds: Normal heart sounds. No murmur heard. No friction rub. No gallop.   Pulmonary:     Effort: Pulmonary effort is normal. No respiratory distress.     Breath sounds: Normal breath sounds. No stridor. No wheezing, rhonchi or rales.  Abdominal:     General: Abdomen is flat. Bowel sounds are normal. There is no distension.     Palpations: Abdomen is soft. There is no mass.     Tenderness: There is no abdominal tenderness. There is no guarding or rebound.     Hernia: No hernia is present.  Musculoskeletal:        General: Normal range of motion.     Cervical back: Normal range of motion and neck supple. No rigidity. No muscular  tenderness.  Lymphadenopathy:     Cervical: No cervical adenopathy.  Skin:    General: Skin is warm and dry.     Capillary Refill: Capillary refill takes less than 2 seconds.  Neurological:     General: No focal deficit present.     Mental Status: She is alert and oriented to person, place, and time. Mental status is at baseline.  Psychiatric:        Mood and Affect: Mood normal.  Behavior: Behavior normal.        Thought Content: Thought content normal.        Judgment: Judgment normal.     Lab Results  Component Value Date   TSH 2.030 07/01/2018   Lab Results  Component Value Date   WBC 4.2 11/07/2019   HGB 12.8 11/07/2019   HCT 39.3 11/07/2019   MCV 95 11/07/2019   PLT 168 11/07/2019   Lab Results  Component Value Date   NA 141 11/07/2019   K 4.5 11/07/2019   CO2 24 11/07/2019   GLUCOSE 88 11/07/2019   BUN 17 11/07/2019   CREATININE 0.87 11/07/2019   BILITOT 0.3 11/07/2019   ALKPHOS 101 11/07/2019   AST 35 11/07/2019   ALT 56 (H) 11/07/2019   PROT 6.6 11/07/2019   ALBUMIN 4.3 11/07/2019   CALCIUM 9.4 11/07/2019   Lab Results  Component Value Date   CHOL 200 (H) 11/07/2019   Lab Results  Component Value Date   HDL 57 11/07/2019   Lab Results  Component Value Date   LDLCALC 127 (H) 11/07/2019   Lab Results  Component Value Date   TRIG 87 11/07/2019   Lab Results  Component Value Date   CHOLHDL 3.5 11/07/2019   No results found for: HGBA1C

## 2020-07-16 NOTE — Patient Instructions (Signed)
Preventive Care 56-56 Years Old, Female Preventive care refers to lifestyle choices and visits with your health care provider that can promote health and wellness. This includes:  A yearly physical exam. This is also called an annual wellness visit.  Regular dental and eye exams.  Immunizations.  Screening for certain conditions.  Healthy lifestyle choices, such as: ? Eating a healthy diet. ? Getting regular exercise. ? Not using drugs or products that contain nicotine and tobacco. ? Limiting alcohol use. What can I expect for my preventive care visit? Physical exam Your health care provider will check your:  Height and weight. These may be used to calculate your BMI (body mass index). BMI is a measurement that tells if you are at a healthy weight.  Heart rate and blood pressure.  Body temperature.  Skin for abnormal spots. Counseling Your health care provider may ask you questions about your:  Past medical problems.  Family's medical history.  Alcohol, tobacco, and drug use.  Emotional well-being.  Home life and relationship well-being.  Sexual activity.  Diet, exercise, and sleep habits.  Work and work Statistician.  Access to firearms.  Method of birth control.  Menstrual cycle.  Pregnancy history. What immunizations do I need? Vaccines are usually given at various ages, according to a schedule. Your health care provider will recommend vaccines for you based on your age, medical history, and lifestyle or other factors, such as travel or where you work.   What tests do I need? Blood tests  Lipid and cholesterol levels. These may be checked every 5 years, or more often if you are over 3 years old.  Hepatitis C test.  Hepatitis B test. Screening  Lung cancer screening. You may have this screening every year starting at age 56 if you have a 30-pack-year history of smoking and currently smoke or have quit within the past 15 years.  Colorectal cancer  screening. ? All adults should have this screening starting at age 56 and continuing until age 17. ? Your health care provider may recommend screening at age 56 if you are at increased risk. ? You will have tests every 1-10 years, depending on your results and the type of screening test.  Diabetes screening. ? This is done by checking your blood sugar (glucose) after you have not eaten for a while (fasting). ? You may have this done every 1-3 years.  Mammogram. ? This may be done every 1-2 years. ? Talk with your health care provider about when you should start having regular mammograms. This may depend on whether you have a family history of breast cancer.  BRCA-related cancer screening. This may be done if you have a family history of breast, ovarian, tubal, or peritoneal cancers.  Pelvic exam and Pap test. ? This may be done every 3 years starting at age 56. ? Starting at age 56, this may be done every 5 years if you have a Pap test in combination with an HPV test. Other tests  STD (sexually transmitted disease) testing, if you are at risk.  Bone density scan. This is done to screen for osteoporosis. You may have this scan if you are at high risk for osteoporosis. Talk with your health care provider about your test results, treatment options, and if necessary, the need for more tests. Follow these instructions at home: Eating and drinking  Eat a diet that includes fresh fruits and vegetables, whole grains, lean protein, and low-fat dairy products.  Take vitamin and mineral supplements  as recommended by your health care provider.  Do not drink alcohol if: ? Your health care provider tells you not to drink. ? You are pregnant, may be pregnant, or are planning to become pregnant.  If you drink alcohol: ? Limit how much you have to 0-1 drink a day. ? Be aware of how much alcohol is in your drink. In the [place] one drink equals one 12 oz bottle of beer (355 mL), one 5 oz glass of  wine (148 mL), or one 1 oz glass of hard liquor (44 mL).   Lifestyle  Take daily care of your teeth and gums. Brush your teeth every morning and night with fluoride toothpaste. Floss one time each day.  Stay active. Exercise for at least 30 minutes 5 or more days each week.  Do not use any products that contain nicotine or tobacco, such as cigarettes, e-cigarettes, and chewing tobacco. If you need help quitting, ask your health care provider.  Do not use drugs.  If you are sexually active, practice safe sex. Use a condom or other form of protection to prevent STIs (sexually transmitted infections).  If you do not wish to become pregnant, use a form of birth control. If you plan to become pregnant, see your health care provider for a prepregnancy visit.  If told by your health care provider, take low-dose aspirin daily starting at age 56.  Find healthy ways to cope with stress, such as: ? Meditation, yoga, or listening to music. ? Journaling. ? Talking to a trusted person. ? Spending time with friends and family. Safety  Always wear your seat belt while driving or riding in a vehicle.  Do not drive: ? If you have been drinking alcohol. Do not ride with someone who has been drinking. ? When you are tired or distracted. ? While texting.  Wear a helmet and other protective equipment during sports activities.  If you have firearms in your house, make sure you follow all gun safety procedures. What's next?  Visit your health care provider once a year for an annual wellness visit.  Ask your health care provider how often you should have your eyes and teeth checked.  Stay up to date on all vaccines. This information is not intended to replace advice given to you by your health care provider. Make sure you discuss any questions you have with your health care provider. Document Revised: 01/23/2020 Document Reviewed: 12/30/2017 Elsevier Patient Education  2021 [employer].

## 2020-07-16 NOTE — Addendum Note (Signed)
Addended by: Margorie [name] on: 07/16/2020 11:40 AM   Modules accepted: Orders

## 2020-07-17 ENCOUNTER — Encounter: Payer: Self-pay | Admitting: Family Medicine

## 2020-07-17 DIAGNOSIS — E782 Mixed hyperlipidemia: Secondary | ICD-10-CM

## 2020-07-17 HISTORY — DX: Mixed hyperlipidemia: E78.2

## 2020-07-17 LAB — CMP14+EGFR
ALT: 36 IU/L — ABNORMAL HIGH (ref 0–32)
AST: 24 IU/L (ref 0–40)
Albumin/Globulin Ratio: 2 (ref 1.2–2.2)
Albumin: 4.3 g/dL (ref 3.8–4.9)
Alkaline Phosphatase: 102 IU/L (ref 44–121)
BUN/Creatinine Ratio: 18 (ref 9–23)
BUN: 18 mg/dL (ref 6–24)
Bilirubin Total: 0.3 mg/dL (ref 0.0–1.2)
CO2: 22 mmol/L (ref 20–29)
Calcium: 9.2 mg/dL (ref 8.7–10.2)
Chloride: 109 mmol/L — ABNORMAL HIGH (ref 96–106)
Creatinine, Ser: 1 mg/dL (ref 0.57–1.00)
Globulin, Total: 2.2 g/dL (ref 1.5–4.5)
Glucose: 92 mg/dL (ref 65–99)
Potassium: 4.4 mmol/L (ref 3.5–5.2)
Sodium: 143 mmol/L (ref 134–144)
Total Protein: 6.5 g/dL (ref 6.0–8.5)
eGFR: 67 mL/min/{1.73_m2} (ref >59)

## 2020-07-17 LAB — LIPID PANEL
Chol/HDL Ratio: 3.2 ratio (ref 0.0–4.4)
Cholesterol, Total: 217 mg/dL — ABNORMAL HIGH (ref 100–199)
HDL: 68 mg/dL (ref >39)
LDL Chol Calc (NIH): 138 mg/dL — ABNORMAL HIGH (ref 0–99)
Triglycerides: 62 mg/dL (ref 0–149)
VLDL Cholesterol Cal: 11 mg/dL (ref 5–40)

## 2020-07-18 ENCOUNTER — Encounter: Payer: BC Managed Care – PPO | Admitting: Family Medicine

## 2020-08-11 ENCOUNTER — Other Ambulatory Visit: Payer: Self-pay | Admitting: Family Medicine

## 2020-08-11 DIAGNOSIS — N390 Urinary tract infection, site not specified: Secondary | ICD-10-CM

## 2020-08-13 NOTE — Telephone Encounter (Signed)
Husband called to check status on refill request for Nitrofurantoin. Explained to husband that based on notes, it looks like it may require a PA and that a nurse would contact him with update on medicine when available.

## 2020-08-21 DIAGNOSIS — H2513 Age-related nuclear cataract, bilateral: Secondary | ICD-10-CM | POA: Diagnosis not present

## 2020-08-21 DIAGNOSIS — H3552 Pigmentary retinal dystrophy: Secondary | ICD-10-CM | POA: Diagnosis not present

## 2020-11-29 DIAGNOSIS — F411 Generalized anxiety disorder: Secondary | ICD-10-CM | POA: Diagnosis not present

## 2020-11-29 DIAGNOSIS — G3109 Other frontotemporal dementia: Secondary | ICD-10-CM | POA: Diagnosis not present

## 2020-11-29 DIAGNOSIS — F4312 Post-traumatic stress disorder, chronic: Secondary | ICD-10-CM | POA: Diagnosis not present

## 2021-01-10 ENCOUNTER — Encounter: Payer: Self-pay | Admitting: Family Medicine

## 2021-01-10 ENCOUNTER — Ambulatory Visit: Payer: BC Managed Care – PPO | Admitting: Family Medicine

## 2021-01-10 ENCOUNTER — Other Ambulatory Visit: Payer: Self-pay

## 2021-01-10 VITALS — BP 103/72 | HR 62 | Temp 97.6°F | Ht 67.0 in | Wt 180.2 lb

## 2021-01-10 DIAGNOSIS — Z789 Other specified health status: Secondary | ICD-10-CM

## 2021-01-10 DIAGNOSIS — F0281 Dementia in other diseases classified elsewhere with behavioral disturbance: Secondary | ICD-10-CM | POA: Diagnosis not present

## 2021-01-10 DIAGNOSIS — E785 Hyperlipidemia, unspecified: Secondary | ICD-10-CM

## 2021-01-10 DIAGNOSIS — F02818 Dementia in other diseases classified elsewhere, unspecified severity, with other behavioral disturbance: Secondary | ICD-10-CM

## 2021-01-10 DIAGNOSIS — Z742 Need for assistance at home and no other household member able to render care: Secondary | ICD-10-CM | POA: Diagnosis not present

## 2021-01-10 NOTE — Progress Notes (Signed)
Assessment & Plan:  1-2. Dementia due to medical condition with behavioral disturbance (HCC)/Needs assistance while at home Discussed with patient I am not sure that we are going to be able to find what he is looking for. He states his insurance company told him they offer this. I will call the insurance company and try to figure out what needs to be ordered to get her the care she needs.   3. Unknown status of immunity to COVID-19 virus - SARS-CoV-2 Semi-Quantitative Total Antibody, Spike   Return on/after 07/16/21, for annual physical.  Hendricks Limes, MSN, APRN, FNP-C [facility]  Subjective:    Patient ID: Courtney Allen, female    DOB: 06/22/1965, 55 y.o.   MRN: [id number]  Patient Care Team: Loman Brooklyn, FNP as PCP - General (Family Medicine) Danie Binder, MD (Inactive) as Consulting Physician (Gastroenterology)   Chief Complaint:  Chief Complaint  Patient presents with   Hyperlipidemia    Check up if chronic medical conditions     HPI: Courtney Allen is a 55 y.o. female presenting on 01/10/2021 for Hyperlipidemia (Check up if chronic medical conditions )  Patient is accompanied by her husband as he is her primary care giver.   Husband reports he is in need of home health for his wife due to an upcoming surgery for himself. He will not be able to care for her immediately post-op. His surgery is scheduled on 02/17/2021. She will need assistance with medications and someone just to be with her and keep her safe due to her dementia. He does not want her to have to go to a nursing home.  New complaints: He is also requesting lab work to make sure she still has COVID antibodies.   Social history:  Relevant past medical, surgical, family and social history reviewed and updated as indicated. Interim medical history since our last visit reviewed.  Allergies and medications reviewed and updated.  DATA REVIEWED: CHART IN EPIC  ROS:  Negative unless specifically indicated above in HPI.    Current Outpatient Medications:    buPROPion (WELLBUTRIN XL) 150 MG 24 hr tablet, Take 1 tablet (150 mg total) by mouth daily., Disp: 90 tablet, Rfl: 3   cholecalciferol (VITAMIN D) 1000 UNITS tablet, Take 1,000 Units by mouth daily., Disp: , Rfl:    hydrOXYzine (ATARAX/VISTARIL) 25 MG tablet, Take 25 mg by mouth 2 (two) times daily., Disp: , Rfl:    LORazepam (ATIVAN) 1 MG tablet, Take 1 mg by mouth daily as needed., Disp: , Rfl:    mirtazapine (REMERON) 30 MG tablet, Take 1 tablet (30 mg total) by mouth at bedtime., Disp: 30 tablet, Rfl: 1   nitrofurantoin, macrocrystal-monohydrate, (MACROBID) 100 MG capsule, Take 1 capsule (100 mg total) by mouth at bedtime., Disp: 90 capsule, Rfl: 1   QUEtiapine (SEROQUEL) 50 MG tablet, Take 50 mg PO in the AM and 100 mg QHS., Disp: , Rfl:    No Known Allergies Past Medical History:  Diagnosis Date   Bipolar I disorder, most recent episode (or current) unspecified    Dementia in conditions classified elsewhere with behavioral disturbance 01/18/2013   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    Depression    Developmental delay    Fetal alcohol syndrome 01/18/2013   General learning disability 01/18/2013   Memory loss    Mental retardation, moderate (I.Q. 35-49) 01/18/2013   Mixed hyperlipidemia 07/17/2020   Retinitis pigmentosa, both eyes 01/18/2013  Urinary incontinence due to cognitive impairment 01/18/2013    Past Surgical History:  Procedure Laterality Date   CESAREAN SECTION     TONSILLECTOMY     TUBAL LIGATION      Social History   Socioeconomic History   Marital status: Married    Spouse name: [name]   Number of children: 3   Years of education: 11   Highest education level: Not on file  Occupational History   Occupation: disabled  Tobacco Use   Smoking status: Never   Smokeless tobacco: Never  Substance and Sexual Activity   Alcohol  use: No   Drug use: No   Sexual activity: Not on file  Other Topics Concern   Not on file  Social History Narrative   Not on file   Social Determinants of Health   Financial Resource Strain: Not on file  Food Insecurity: Not on file  Transportation Needs: Not on file  Physical Activity: Not on file  Stress: Not on file  Social Connections: Not on file  Intimate Partner Violence: Not on file        Objective:    BP 103/72   Pulse 62   Temp 97.6 F (36.4 C) (Temporal)   Ht 5' 7"  (1.702 m)   Wt 180 lb 3.2 oz (81.7 kg)   BMI 28.22 kg/m   Wt Readings from Last 3 Encounters:  01/10/21 180 lb 3.2 oz (81.7 kg)  07/16/20 178 lb (80.7 kg)  05/01/20 173 lb 2 oz (78.5 kg)    Physical Exam Vitals reviewed.  Constitutional:      General: She is not in acute distress.    Appearance: Normal appearance. She is overweight. She is not ill-appearing, toxic-appearing or diaphoretic.  HENT:     Head: Normocephalic and atraumatic.  Eyes:     General: No scleral icterus.       Right eye: No discharge.        Left eye: No discharge.     Conjunctiva/sclera: Conjunctivae normal.  Cardiovascular:     Rate and Rhythm: Normal rate.  Pulmonary:     Effort: Pulmonary effort is normal. No respiratory distress.  Musculoskeletal:        General: Normal range of motion.     Cervical back: Normal range of motion.  Skin:    General: Skin is warm and dry.     Capillary Refill: Capillary refill takes less than 2 seconds.  Neurological:     General: No focal deficit present.     Mental Status: She is alert and oriented to person, place, and time. Mental status is at baseline.  Psychiatric:        Mood and Affect: Mood normal.        Behavior: Behavior normal.        Thought Content: Thought content normal.        Judgment: Judgment normal.    Lab Results  Component Value Date   TSH 2.030 07/01/2018   Lab Results  Component Value Date   WBC 4.2 11/07/2019   HGB 12.8 11/07/2019    HCT 39.3 11/07/2019   MCV 95 11/07/2019   PLT 168 11/07/2019   Lab Results  Component Value Date   NA 143 07/16/2020   K 4.4 07/16/2020   CO2 22 07/16/2020   GLUCOSE 92 07/16/2020   BUN 18 07/16/2020   CREATININE 1.00 07/16/2020   BILITOT 0.3 07/16/2020   ALKPHOS 102 07/16/2020   AST 24 07/16/2020  ALT 36 (H) 07/16/2020   PROT 6.5 07/16/2020   ALBUMIN 4.3 07/16/2020   CALCIUM 9.2 07/16/2020   EGFR 67 07/16/2020   Lab Results  Component Value Date   CHOL 217 (H) 07/16/2020   Lab Results  Component Value Date   HDL 68 07/16/2020   Lab Results  Component Value Date   LDLCALC 138 (H) 07/16/2020   Lab Results  Component Value Date   TRIG 62 07/16/2020   Lab Results  Component Value Date   CHOLHDL 3.2 07/16/2020   No results found for: HGBA1C

## 2021-01-10 NOTE — Patient Instructions (Signed)
Reminder: please return after mid-September for your flu shot. If you receive this elsewhere, such as your pharmacy, please let us know so we can get this documented in your chart. We have flu clinic days scheduled during the month of October, if you would like to go ahead and schedule for this.   Please schedule colonoscopy.  Schedule mammogram on the bus.

## 2021-01-14 LAB — SARS-COV-2 SEMI-QUANTITATIVE TOTAL ANTIBODY, SPIKE
SARS-CoV-2 Semi-Quant Total Ab: 129.7 U/mL (ref <0.8)
SARS-CoV-2 Spike Ab Interp: POSITIVE

## 2021-01-17 ENCOUNTER — Telehealth: Payer: Self-pay | Admitting: Family Medicine

## 2021-01-17 NOTE — Telephone Encounter (Signed)
Husband aware and verbalizes understanding.  

## 2021-01-17 NOTE — Telephone Encounter (Signed)
Can we please call husband and let him know that our office has spoken to [facility] and we are unable to identify something that we could order to give his wife someone to assist her after his surgery.

## 2021-01-17 NOTE — Telephone Encounter (Signed)
[email or web address]

## 2021-01-17 NOTE — Telephone Encounter (Signed)
Returning nurse call.

## 2021-01-22 ENCOUNTER — Ambulatory Visit: Payer: BC Managed Care – PPO | Admitting: Family Medicine

## 2021-01-22 ENCOUNTER — Ambulatory Visit (INDEPENDENT_AMBULATORY_CARE_PROVIDER_SITE_OTHER): Payer: BC Managed Care – PPO

## 2021-01-22 DIAGNOSIS — Z23 Encounter for immunization: Secondary | ICD-10-CM | POA: Diagnosis not present

## 2021-02-05 ENCOUNTER — Other Ambulatory Visit: Payer: Self-pay | Admitting: Family Medicine

## 2021-02-05 DIAGNOSIS — N390 Urinary tract infection, site not specified: Secondary | ICD-10-CM

## 2021-02-24 DIAGNOSIS — Z111 Encounter for screening for respiratory tuberculosis: Secondary | ICD-10-CM | POA: Diagnosis not present

## 2021-03-15 ENCOUNTER — Emergency Department (HOSPITAL_COMMUNITY): Payer: BC Managed Care – PPO

## 2021-03-15 ENCOUNTER — Emergency Department (HOSPITAL_COMMUNITY)
Admission: EM | Admit: 2021-03-15 | Discharge: 2021-03-16 | Disposition: A | Discharge status: Home / Self Care | Payer: BC Managed Care – PPO | Attending: Emergency Medicine | Admitting: Emergency Medicine

## 2021-03-15 DIAGNOSIS — F03911 Unspecified dementia, unspecified severity, with agitation: Secondary | ICD-10-CM | POA: Diagnosis not present

## 2021-03-15 DIAGNOSIS — R4182 Altered mental status, unspecified: Secondary | ICD-10-CM | POA: Diagnosis not present

## 2021-03-15 LAB — COMPREHENSIVE METABOLIC PANEL
ALT: 25 U/L (ref 0–44)
AST: 22 U/L (ref 15–41)
Albumin: 3.7 g/dL (ref 3.5–5.0)
Alkaline Phosphatase: 69 U/L (ref 38–126)
Anion gap: 9 (ref 5–15)
BUN: 15 mg/dL (ref 6–20)
CO2: 21 mmol/L — ABNORMAL LOW (ref 22–32)
Calcium: 9 mg/dL (ref 8.9–10.3)
Chloride: 109 mmol/L (ref 98–111)
Creatinine, Ser: 0.84 mg/dL (ref 0.44–1.00)
GFR, Estimated: >60 mL/min (ref >60)
Glucose, Bld: 85 mg/dL (ref 70–99)
Potassium: 4 mmol/L (ref 3.5–5.1)
Sodium: 139 mmol/L (ref 135–145)
Total Bilirubin: 0.7 mg/dL (ref 0.3–1.2)
Total Protein: 6.2 g/dL — ABNORMAL LOW (ref 6.5–8.1)

## 2021-03-15 LAB — URINALYSIS, ROUTINE W REFLEX MICROSCOPIC
Bilirubin Urine: NEGATIVE
Glucose, UA: NEGATIVE mg/dL
Hgb urine dipstick: NEGATIVE
Ketones, ur: 20 mg/dL — AB
Leukocytes,Ua: NEGATIVE
Nitrite: NEGATIVE
Protein, ur: NEGATIVE mg/dL
Specific Gravity, Urine: 1.008 (ref 1.005–1.030)
pH: 5 (ref 5.0–8.0)

## 2021-03-15 LAB — CBC WITH DIFFERENTIAL/PLATELET
Abs Immature Granulocytes: 0.02 10*3/uL (ref 0.00–0.07)
Basophils Absolute: 0 10*3/uL (ref 0.0–0.1)
Basophils Relative: 1 %
Eosinophils Absolute: 0.1 10*3/uL (ref 0.0–0.5)
Eosinophils Relative: 1 %
HCT: 35.8 % — ABNORMAL LOW (ref 36.0–46.0)
Hemoglobin: 11.7 g/dL — ABNORMAL LOW (ref 12.0–15.0)
Immature Granulocytes: 0 %
Lymphocytes Relative: 34 %
Lymphs Abs: 2.1 10*3/uL (ref 0.7–4.0)
MCH: 32.1 pg (ref 26.0–34.0)
MCHC: 32.7 g/dL (ref 30.0–36.0)
MCV: 98.1 fL (ref 80.0–100.0)
Monocytes Absolute: 0.6 10*3/uL (ref 0.1–1.0)
Monocytes Relative: 9 %
Neutro Abs: 3.3 10*3/uL (ref 1.7–7.7)
Neutrophils Relative %: 55 %
Platelets: 185 10*3/uL (ref 150–400)
RBC: 3.65 MIL/uL — ABNORMAL LOW (ref 3.87–5.11)
RDW: 11.9 % (ref 11.5–15.5)
WBC: 6.1 10*3/uL (ref 4.0–10.5)
nRBC: 0 % (ref 0.0–0.2)

## 2021-03-15 MED ORDER — LORAZEPAM 2 MG/ML IJ SOLN
2.0000 mg | Freq: Once | INTRAMUSCULAR | Status: AC
  Administered 2021-03-15: 2 mg via INTRAVENOUS
  Filled 2021-03-15: qty 1

## 2021-03-15 MED ORDER — HALOPERIDOL LACTATE 5 MG/ML IJ SOLN
2.0000 mg | Freq: Once | INTRAMUSCULAR | Status: AC
  Administered 2021-03-15: 2 mg via INTRAVENOUS
  Filled 2021-03-15: qty 1

## 2021-03-15 MED ORDER — LORAZEPAM 2 MG/ML IJ SOLN
1.0000 mg | Freq: Once | INTRAMUSCULAR | Status: AC
  Administered 2021-03-15: 1 mg via INTRAVENOUS
  Filled 2021-03-15: qty 1

## 2021-03-15 MED ORDER — DIAZEPAM 5 MG/ML IJ SOLN
5.0000 mg | Freq: Once | INTRAMUSCULAR | Status: AC
  Administered 2021-03-15: 5 mg via INTRAVENOUS
  Filled 2021-03-15: qty 2

## 2021-03-15 NOTE — ED Notes (Signed)
Report called to [facility], made aware of pt's return.

## 2021-03-15 NOTE — ED Notes (Signed)
PTAR CALLED UNABLE TO GIVE PICK UP TIME 

## 2021-03-15 NOTE — ED Triage Notes (Signed)
Pt BIB [facility] from a nursing facility .Per [facility], Pt has dementia, but today pt is more aggressive than normal. Facility also suspecting UTI.    BP 140/80 84 pulse  99% RA

## 2021-03-15 NOTE — ED Provider Notes (Signed)
[facility] EMERGENCY DEPARTMENT Provider Note   CSN: [id number] Arrival date & time: 03/15/21  1529     History No chief complaint on file.   Courtney Allen is a 55 y.o. female history of dementia, here presenting with altered mental status.  Patient is from a nursing home.  She has underlying dementia.  Patient was noted to be more agitated than usual.  Patient is on Klonopin and Remeron at baseline.  Patient is unable to give much history.  Facility sent him here for concern for possible UTI  The history is provided by the EMS personnel and the nursing home.      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    Depression    Developmental delay    Fetal alcohol syndrome 01/18/2013   General learning disability 01/18/2013   Memory loss    Mental retardation, moderate (I.Q. 35-49) 01/18/2013   Mixed hyperlipidemia 07/17/2020   Retinitis pigmentosa, both eyes 01/18/2013   Urinary incontinence due to cognitive impairment 01/18/2013    Patient Active Problem List   Diagnosis Date Noted   Mixed hyperlipidemia 07/17/2020   History of recurrent UTI (urinary tract infection) 04/24/2020   High risk medication use 01/06/2019   Frequent UTI 10/07/2018   Postmenopausal 07/01/2018   Chronic midline low back pain without sciatica 05/13/2018   Mental retardation, moderate (I.Q. 35-49) 01/18/2013   Fetal alcohol syndrome 01/18/2013   General learning disability 01/18/2013   Urinary incontinence due to cognitive impairment 01/18/2013   Retinitis pigmentosa, both eyes 01/18/2013   Dementia due to medical condition with behavioral disturbance 01/18/2013    Past Surgical History:  Procedure Laterality Date   CESAREAN SECTION     TONSILLECTOMY      TUBAL LIGATION       OB History   No obstetric history on file.     Family History  Problem Relation Age of Onset   Alcohol abuse Mother    Heart attack Mother 28   Diabetes Mother    Cancer Mother        Deceased in mid 43s, either colon cancer or pancreatic cancer reported per patient's spouse   Emphysema Brother    Diabetes Brother     Social History   Tobacco Use   Smoking status: Never   Smokeless tobacco: Never  Substance Use Topics   Alcohol use: No   Drug use: No    Home Medications Prior to Admission medications   Medication Sig Start Date End Date Taking? Authorizing Provider  buPROPion (WELLBUTRIN XL) 150 MG 24 hr tablet Take 1 tablet (150 mg total) by mouth daily. 10/07/18   Sonny Masters, FNP  cholecalciferol (VITAMIN D) 1000 UNITS tablet Take 1,000 Units by mouth daily.    [provider]  hydrOXYzine (ATARAX/VISTARIL) 25 MG tablet Take 25 mg by mouth 2 (two) times daily.    [provider]  LORazepam (ATIVAN) 1 MG tablet Take 1 mg by mouth daily as needed. 01/19/20   [provider]  mirtazapine (REMERON) 30 MG tablet Take 1 tablet (30 mg total) by mouth at bedtime. 10/07/18   Sonny Masters, FNP  nitrofurantoin, macrocrystal-monohydrate, (MACROBID) 100 MG capsule TAKE 1 CAPSULE BY MOUTH AT BEDTIME. 02/07/21   Gwenlyn Fudge, FNP  QUEtiapine (  SEROQUEL) 50 MG tablet Take 50 mg PO in the AM and 100 mg QHS.    [provider]    Allergies    Patient has no known allergies.  Review of Systems   Review of Systems  Unable to perform ROS: Dementia  Psychiatric/Behavioral:  Positive for confusion.   All other systems reviewed and are negative.  Physical Exam Updated Vital Signs BP 130/90 (BP Location: Right Arm)   Pulse 63   Temp 98.6 F (37 C) (Oral)   Resp 15   SpO2 100%   Physical Exam Vitals and nursing note reviewed.  Constitutional:      Comments: Altered and agitated.  HENT:     Head: Normocephalic and  atraumatic.     Nose: Nose normal.     Mouth/Throat:     Mouth: Mucous membranes are moist.  Eyes:     Extraocular Movements: Extraocular movements intact.     Pupils: Pupils are equal, round, and reactive to light.  Cardiovascular:     Rate and Rhythm: Normal rate and regular rhythm.     Pulses: Normal pulses.  Pulmonary:     Effort: Pulmonary effort is normal.     Breath sounds: Normal breath sounds.  Abdominal:     General: Abdomen is flat.     Palpations: Abdomen is soft.  Musculoskeletal:        General: Normal range of motion.     Cervical back: Normal range of motion and neck supple.  Skin:    General: Skin is warm.     Capillary Refill: Capillary refill takes less than 2 seconds.  Neurological:     Comments: Demented, agitated, moving all extremities   Psychiatric:        Mood and Affect: Mood normal.        Behavior: Behavior normal.    ED Results / Procedures / Treatments   Labs (all labs ordered are listed, but only abnormal results are displayed) Labs Reviewed  CBC WITH DIFFERENTIAL/PLATELET - Abnormal; Notable for the following components:      Result Value   RBC 3.65 (*)    Hemoglobin 11.7 (*)    HCT 35.8 (*)    All other components within normal limits  COMPREHENSIVE METABOLIC PANEL - Abnormal; Notable for the following components:   CO2 21 (*)    Total Protein 6.2 (*)    All other components within normal limits  URINALYSIS, ROUTINE W REFLEX MICROSCOPIC - Abnormal; Notable for the following components:   Color, Urine STRAW (*)    Ketones, ur 20 (*)    All other components within normal limits  RESP PANEL BY RT-PCR (FLU A&B, COVID) [id number]  URINE CULTURE    EKG None  Radiology CT HEAD WO CONTRAST ( )  Result Date: 03/15/2021 CLINICAL DATA:  Altered mental status.  Mental status change. EXAM: CT HEAD WITHOUT CONTRAST TECHNIQUE: Contiguous axial images were obtained from the base of the skull through the vertex without intravenous contrast.  COMPARISON:  None. FINDINGS: Brain: Patient had difficulty tolerating the exam, there is motion artifact. Generalized atrophy, advanced for age. Mild periventricular chronic small vessel ischemia. No hemorrhage, evidence of acute infarct, hydrocephalus, midline shift or mass effect. No obvious subdural collection. Vascular: Limited assessment of the MCA region due to motion on all provided acquisitions. There is no obvious hyperdense vessel. Skull: Skull base is obscured by motion.  Otherwise negative. Sinuses/Orbits: No acute findings. Other: None. IMPRESSION: 1. Motion limited exam. No obvious acute  abnormality. 2. Generalized atrophy, advanced for age. Mild chronic small vessel ischemia. Electronically Signed   By: Narda Rutherford M.D.   On: 03/15/2021 20:35   DG Chest Port 1 View  Result Date: 03/15/2021 CLINICAL DATA:  Altered mental status EXAM: PORTABLE CHEST 1 VIEW COMPARISON:  None. FINDINGS: The heart size and mediastinal contours are within normal limits. Both lungs are clear. The visualized skeletal structures are unremarkable. IMPRESSION: No active disease. Electronically Signed   By: Sherian Rein M.D.   On: 03/15/2021 17:01    Procedures Procedures   Medications Ordered in ED Medications  LORazepam (ATIVAN) injection 2 mg (2 mg Intravenous Given 03/15/21 1611)  haloperidol lactate (HALDOL) injection 2 mg (2 mg Intravenous Given 03/15/21 1850)  LORazepam (ATIVAN) injection 1 mg (1 mg Intravenous Given 03/15/21 1851)  diazepam (VALIUM) injection 5 mg (5 mg Intravenous Given 03/15/21 1941)    ED Course  I have reviewed the triage vital signs and the nursing notes.  Pertinent labs & imaging results that were available during my care of the patient were reviewed by me and considered in my medical decision making (see chart for details).    MDM Rules/Calculators/A&P                           Akaylah Lalley is a 55 y.o. female hx of dementia, here with agitation. Patient is  demented at baseline. Will get CT head to r/o bleed. Will get labs and UA and CXR.   8:57 PM Labs unremarkable.  Urinalysis did not show UTI.  Chest x-ray is clear.  CT head unremarkable.  At this point, she has no medical cause for her agitation. Likely dementia related.  Patient is already on Klonopin and multiple psych meds.  At this point, patient is stable to discharge back to facility  Final Clinical Impression(s) / ED Diagnoses Final diagnoses:  None    Rx / DC Orders ED Discharge Orders     None        Charlynne Pander, MD 03/15/21 2113

## 2021-03-15 NOTE — Discharge Instructions (Addendum)
Your medical work-up here is unremarkable.  Your CT head and your labs and your urinalysis and your chest x-ray did not show any abnormality and you do not have any infection going on  Please talk to your doctor about increasing your Klonopin or other psychiatric medicine  Return to ER if you have worse agitation, confusion

## 2021-03-16 DIAGNOSIS — R4182 Altered mental status, unspecified: Secondary | ICD-10-CM | POA: Diagnosis not present

## 2021-03-16 DIAGNOSIS — Z743 Need for continuous supervision: Secondary | ICD-10-CM | POA: Diagnosis not present

## 2021-03-16 LAB — URINE CULTURE: Culture: NO GROWTH

## 2021-03-16 NOTE — ED Notes (Signed)
Pt curled in ball. Brief changed.Pt clean and dry

## 2021-04-02 ENCOUNTER — Ambulatory Visit: Payer: BC Managed Care – PPO | Admitting: Family Medicine

## 2021-04-02 ENCOUNTER — Encounter: Payer: Self-pay | Admitting: Family Medicine

## 2021-04-02 VITALS — BP 106/74 | HR 62 | Temp 97.4°F | Ht 67.0 in | Wt 169.6 lb

## 2021-04-02 DIAGNOSIS — F71 Moderate intellectual disabilities: Secondary | ICD-10-CM

## 2021-04-02 DIAGNOSIS — F02818 Dementia in other diseases classified elsewhere, unspecified severity, with other behavioral disturbance: Secondary | ICD-10-CM | POA: Diagnosis not present

## 2021-04-02 DIAGNOSIS — K5900 Constipation, unspecified: Secondary | ICD-10-CM | POA: Diagnosis not present

## 2021-04-02 NOTE — Progress Notes (Signed)
Assessment & Plan:  1. Dementia due to medical condition with behavioral disturbance - acceptable to be off medications providing patient is safe from harm to self and others - encouraged to discuss with psychiatrist before making any medication changes for behavioral disturbances - CMP14+EGFR  Return for annual physical.  Lucile Crater, NP Student  I personally was present during the history, physical exam, and medical decision-making activities of this service and have verified that the service and findings are accurately documented in the nurse practitioner student's note.  Hendricks Limes, MSN, APRN, FNP-C [facility]   Subjective:    Patient ID: Courtney Allen, female    DOB: Apr 30, 1966, 55 y.o.   MRN: [id number]  Patient Care Team: Loman Brooklyn, FNP as PCP - General (Family Medicine) Danie Binder, MD (Inactive) as Consulting Physician (Gastroenterology)   Chief Complaint:  Chief Complaint  Patient presents with   Constipation    Husband states that she was not having BM's or urinating so he took her off some of her medications x 4 days ago and now she is doing better.     HPI: Courtney Allen is a 55 y.o. female presenting on 04/02/2021 for Constipation (Husband states that she was not having BM's or urinating so he took her off some of her medications x 4 days ago and now she is doing better. )  Patient is accompanied by her husband as he is her primary care giver who is providing history.  Husband reports while he was receiving and recovering from surgery she was placed in a nursing home and had some medication changes. They stopped her Wellbutrin and Seroquel for behavioral disturbances of pinching and biting others. She was also sent to the hospital for these disturbances and was given IV ativan, valium, and haldol to help calm her down. She was discharged back to the facility with instructions to use prn Ativan for agitation as  previously prescribed.   At discharge from the facility back to home, her husband stopped the Ativan and restarted Seroquel and Wellbutrin. After a few days, he noticed she was not eating or drinking much, making less urine, and became constipated, and became concerned that her kidneys were not working and stopped her Seroquel, Wellbutrin, hydroxyzine cold [place]. He states in the 5 days that she has been off of it she "feels better" and is making more urine, having regular bowel movements, and eating and drinking normally again.   He is requesting lab work to make sure her kidneys and liver is fine. The medications stopped were prescribed by psychiatry, and he has an appointment scheduled with them in January.    Social history:  Relevant past medical, surgical, family and social history reviewed and updated as indicated. Interim medical history since our last visit reviewed.  Allergies and medications reviewed and updated.  DATA REVIEWED: CHART IN EPIC  ROS: Negative unless specifically indicated above in HPI.    Current Outpatient Medications:    cholecalciferol (VITAMIN D) 1000 UNITS tablet, Take 1,000 Units by mouth daily., Disp: , Rfl:    mirtazapine (REMERON) 30 MG tablet, Take 1 tablet (30 mg total) by mouth at bedtime., Disp: 30 tablet, Rfl: 1   nitrofurantoin, macrocrystal-monohydrate, (MACROBID) 100 MG capsule, TAKE 1 CAPSULE BY MOUTH AT BEDTIME., Disp: 90 capsule, Rfl: 1   No Known Allergies Past Medical History:  Diagnosis Date   Bipolar I disorder, most recent episode (or current) unspecified    Dementia in conditions classified  elsewhere with behavioral disturbance 01/18/2013   Superimposed alzheimer's dementia in a patient with known static encephalopathy.    Depression    Developmental delay    Fetal alcohol syndrome 01/18/2013   General learning disability 01/18/2013   Memory loss    Mental retardation, moderate (I.Q. 35-49) 01/18/2013   Mixed hyperlipidemia  07/17/2020   Retinitis pigmentosa, both eyes 01/18/2013   Urinary incontinence due to cognitive impairment 01/18/2013    Past Surgical History:  Procedure Laterality Date   CESAREAN SECTION     TONSILLECTOMY     TUBAL LIGATION      Social History   Socioeconomic History   Marital status: Married    Spouse name: [name]   Number of children: 3   Years of education: 11   Highest education level: Not on file  Occupational History   Occupation: disabled  Tobacco Use   Smoking status: Never   Smokeless tobacco: Never  Substance and Sexual Activity   Alcohol use: No   Drug use: No   Sexual activity: Not on file  Other Topics Concern   Not on file  Social History Narrative   Not on file   Social Determinants of Health   Financial Resource Strain: Not on file  Food Insecurity: Not on file  Transportation Needs: Not on file  Physical Activity: Not on file  Stress: Not on file  Social Connections: Not on file  Intimate Partner Violence: Not on file        Objective:    BP 106/74   Pulse 62   Temp (!) 97.4 F (36.3 C) (Temporal)   Ht 5' 7"  (1.702 m)   Wt 76.9 kg   BMI 26.56 kg/m   Wt Readings from Last 3 Encounters:  04/02/21 169 lb 9.6 oz (76.9 kg)  01/10/21 180 lb 3.2 oz (81.7 kg)  07/16/20 178 lb (80.7 kg)    Physical Exam Vitals reviewed.  Constitutional:      General: She is not in acute distress.    Appearance: Normal appearance. She is overweight. She is not ill-appearing, toxic-appearing or diaphoretic.  HENT:     Head: Normocephalic and atraumatic.  Eyes:     General: No scleral icterus.       Right eye: No discharge.        Left eye: No discharge.     Conjunctiva/sclera: Conjunctivae normal.  Cardiovascular:     Rate and Rhythm: Normal rate.  Pulmonary:     Effort: Pulmonary effort is normal. No respiratory distress.  Musculoskeletal:        General: Normal range of motion.     Cervical back: Normal range of motion.  Skin:    General: Skin is  warm and dry.     Capillary Refill: Capillary refill takes less than 2 seconds.  Neurological:     General: No focal deficit present.     Mental Status: She is alert. Mental status is at baseline. She is disoriented.  Psychiatric:        Attention and Perception: She is inattentive.        Mood and Affect: Mood normal.        Behavior: Behavior is hyperactive.    Lab Results  Component Value Date   TSH 2.030 07/01/2018   Lab Results  Component Value Date   WBC 6.1 03/15/2021   HGB 11.7 (L) 03/15/2021   HCT 35.8 (L) 03/15/2021   MCV 98.1 03/15/2021   PLT 185 03/15/2021  Lab Results  Component Value Date   NA 139 03/15/2021   K 4.0 03/15/2021   CO2 21 (L) 03/15/2021   GLUCOSE 85 03/15/2021   BUN 15 03/15/2021   CREATININE 0.84 03/15/2021   BILITOT 0.7 03/15/2021   ALKPHOS 69 03/15/2021   AST 22 03/15/2021   ALT 25 03/15/2021   PROT 6.2 (L) 03/15/2021   ALBUMIN 3.7 03/15/2021   CALCIUM 9.0 03/15/2021   ANIONGAP 9 03/15/2021   EGFR 67 07/16/2020   Lab Results  Component Value Date   CHOL 217 (H) 07/16/2020   Lab Results  Component Value Date   HDL 68 07/16/2020   Lab Results  Component Value Date   LDLCALC 138 (H) 07/16/2020   Lab Results  Component Value Date   TRIG 62 07/16/2020   Lab Results  Component Value Date   CHOLHDL 3.2 07/16/2020   No results found for: HGBA1C

## 2021-04-03 ENCOUNTER — Ambulatory Visit: Payer: BC Managed Care – PPO | Admitting: Family Medicine

## 2021-04-03 LAB — CMP14+EGFR
ALT: 15 IU/L (ref 0–32)
AST: 15 IU/L (ref 0–40)
Albumin/Globulin Ratio: 2.1 (ref 1.2–2.2)
Albumin: 4.5 g/dL (ref 3.8–4.9)
Alkaline Phosphatase: 97 IU/L (ref 44–121)
BUN/Creatinine Ratio: 14 (ref 9–23)
BUN: 12 mg/dL (ref 6–24)
Bilirubin Total: 0.3 mg/dL (ref 0.0–1.2)
CO2: 22 mmol/L (ref 20–29)
Calcium: 9.6 mg/dL (ref 8.7–10.2)
Chloride: 106 mmol/L (ref 96–106)
Creatinine, Ser: 0.88 mg/dL (ref 0.57–1.00)
Globulin, Total: 2.1 g/dL (ref 1.5–4.5)
Glucose: 92 mg/dL (ref 70–99)
Potassium: 4.3 mmol/L (ref 3.5–5.2)
Sodium: 143 mmol/L (ref 134–144)
Total Protein: 6.6 g/dL (ref 6.0–8.5)
eGFR: 77 mL/min/{1.73_m2} (ref >59)

## 2021-04-07 ENCOUNTER — Encounter: Payer: Self-pay | Admitting: Family Medicine

## 2021-05-01 ENCOUNTER — Other Ambulatory Visit: Payer: Self-pay | Admitting: Family Medicine

## 2021-05-01 DIAGNOSIS — Z139 Encounter for screening, unspecified: Secondary | ICD-10-CM

## 2021-05-14 ENCOUNTER — Ambulatory Visit
Admission: RE | Admit: 2021-05-14 | Discharge: 2021-05-14 | Disposition: A | Discharge status: Home / Self Care | Payer: BC Managed Care – PPO | Source: Ambulatory Visit | Attending: Family Medicine | Admitting: Family Medicine

## 2021-05-14 DIAGNOSIS — Z139 Encounter for screening, unspecified: Secondary | ICD-10-CM

## 2021-05-16 DIAGNOSIS — F4312 Post-traumatic stress disorder, chronic: Secondary | ICD-10-CM | POA: Diagnosis not present

## 2021-05-16 DIAGNOSIS — F79 Unspecified intellectual disabilities: Secondary | ICD-10-CM | POA: Diagnosis not present

## 2021-05-16 DIAGNOSIS — F411 Generalized anxiety disorder: Secondary | ICD-10-CM | POA: Diagnosis not present

## 2021-05-16 DIAGNOSIS — G3109 Other frontotemporal dementia: Secondary | ICD-10-CM | POA: Diagnosis not present

## 2021-05-28 ENCOUNTER — Other Ambulatory Visit: Payer: BC Managed Care – PPO

## 2021-05-28 DIAGNOSIS — Z79899 Other long term (current) drug therapy: Secondary | ICD-10-CM | POA: Diagnosis not present

## 2021-05-29 LAB — CBC WITH DIFFERENTIAL/PLATELET
Basophils Absolute: 0 10*3/uL (ref 0.0–0.2)
Basos: 1 %
EOS (ABSOLUTE): 0.1 10*3/uL (ref 0.0–0.4)
Eos: 2 %
Hematocrit: 38 % (ref 34.0–46.6)
Hemoglobin: 12.8 g/dL (ref 11.1–15.9)
Immature Grans (Abs): 0 10*3/uL (ref 0.0–0.1)
Immature Granulocytes: 0 %
Lymphocytes Absolute: 1.3 10*3/uL (ref 0.7–3.1)
Lymphs: 35 %
MCH: 31.5 pg (ref 26.6–33.0)
MCHC: 33.7 g/dL (ref 31.5–35.7)
MCV: 94 fL (ref 79–97)
Monocytes Absolute: 0.3 10*3/uL (ref 0.1–0.9)
Monocytes: 9 %
Neutrophils Absolute: 2 10*3/uL (ref 1.4–7.0)
Neutrophils: 53 %
Platelets: 166 10*3/uL (ref 150–450)
RBC: 4.06 x10E6/uL (ref 3.77–5.28)
RDW: 12.1 % (ref 11.7–15.4)
WBC: 3.8 10*3/uL (ref 3.4–10.8)

## 2021-05-29 LAB — CMP14+EGFR
ALT: 12 IU/L (ref 0–32)
AST: 13 IU/L (ref 0–40)
Albumin/Globulin Ratio: 1.6 (ref 1.2–2.2)
Albumin: 3.9 g/dL (ref 3.8–4.9)
Alkaline Phosphatase: 95 IU/L (ref 44–121)
BUN/Creatinine Ratio: 15 (ref 9–23)
BUN: 13 mg/dL (ref 6–24)
Bilirubin Total: 0.4 mg/dL (ref 0.0–1.2)
CO2: 24 mmol/L (ref 20–29)
Calcium: 9.3 mg/dL (ref 8.7–10.2)
Chloride: 104 mmol/L (ref 96–106)
Creatinine, Ser: 0.88 mg/dL (ref 0.57–1.00)
Globulin, Total: 2.4 g/dL (ref 1.5–4.5)
Glucose: 90 mg/dL (ref 70–99)
Potassium: 4.2 mmol/L (ref 3.5–5.2)
Sodium: 139 mmol/L (ref 134–144)
Total Protein: 6.3 g/dL (ref 6.0–8.5)
eGFR: 77 mL/min/{1.73_m2} (ref >59)

## 2021-05-29 LAB — VITAMIN B12: Vitamin B-12: 825 pg/mL (ref 232–1245)

## 2021-05-29 LAB — THYROID PANEL WITH TSH
Free Thyroxine Index: 2 (ref 1.2–4.9)
T3 Uptake Ratio: 29 % (ref 24–39)
T4, Total: 7 ug/dL (ref 4.5–12.0)
TSH: 2.7 u[IU]/mL (ref 0.450–4.500)

## 2021-05-29 LAB — LIPID PANEL
Chol/HDL Ratio: 3.3 ratio (ref 0.0–4.4)
Cholesterol, Total: 195 mg/dL (ref 100–199)
HDL: 59 mg/dL (ref >39)
LDL Chol Calc (NIH): 125 mg/dL — ABNORMAL HIGH (ref 0–99)
Triglycerides: 60 mg/dL (ref 0–149)
VLDL Cholesterol Cal: 11 mg/dL (ref 5–40)

## 2021-05-29 LAB — LDL CHOLESTEROL, DIRECT: LDL Direct: 126 mg/dL — ABNORMAL HIGH (ref 0–99)

## 2021-05-29 LAB — VITAMIN D 25 HYDROXY (VIT D DEFICIENCY, FRACTURES): Vit D, 25-Hydroxy: 38.9 ng/mL (ref 30.0–100.0)

## 2021-06-12 ENCOUNTER — Telehealth: Payer: Self-pay | Admitting: Family Medicine

## 2021-06-12 DIAGNOSIS — N390 Urinary tract infection, site not specified: Secondary | ICD-10-CM

## 2021-06-16 ENCOUNTER — Other Ambulatory Visit: Payer: Self-pay | Admitting: Family Medicine

## 2021-06-16 DIAGNOSIS — N390 Urinary tract infection, site not specified: Secondary | ICD-10-CM

## 2021-06-16 NOTE — Telephone Encounter (Signed)
Drug Nitrofurantoin Macrocrystal 100MG  capsules  Key: [id number]  Sent to [facility]

## 2021-06-17 MED ORDER — NITROFURANTOIN MACROCRYSTAL 100 MG PO CAPS: 100.0000 mg | ORAL_CAPSULE | Freq: Every day | ORAL | 1 refills | Status: DC

## 2021-06-17 NOTE — Telephone Encounter (Signed)
Prescription changed

## 2021-06-17 NOTE — Telephone Encounter (Signed)
Pharmacy states that insurance will cover macrodantin. Can we change?

## 2021-06-17 NOTE — Telephone Encounter (Signed)
Pt husband aware

## 2021-07-09 ENCOUNTER — Telehealth: Payer: Self-pay | Admitting: Family Medicine

## 2021-07-09 NOTE — Telephone Encounter (Signed)
Attempted to contact - NA 

## 2021-07-11 NOTE — Telephone Encounter (Signed)
Appointment made

## 2021-08-01 ENCOUNTER — Ambulatory Visit (INDEPENDENT_AMBULATORY_CARE_PROVIDER_SITE_OTHER): Payer: BC Managed Care – PPO | Admitting: Family Medicine

## 2021-08-01 ENCOUNTER — Encounter: Payer: Self-pay | Admitting: Family Medicine

## 2021-08-01 VITALS — BP 91/64 | HR 98 | Temp 97.4°F | Ht 67.0 in | Wt 159.2 lb

## 2021-08-01 DIAGNOSIS — F02818 Dementia in other diseases classified elsewhere, unspecified severity, with other behavioral disturbance: Secondary | ICD-10-CM

## 2021-08-01 DIAGNOSIS — Z23 Encounter for immunization: Secondary | ICD-10-CM

## 2021-08-01 DIAGNOSIS — Z0000 Encounter for general adult medical examination without abnormal findings: Secondary | ICD-10-CM | POA: Diagnosis not present

## 2021-08-01 DIAGNOSIS — Z0189 Encounter for other specified special examinations: Secondary | ICD-10-CM

## 2021-08-01 LAB — BAYER DCA HB A1C WAIVED: HB A1C (BAYER DCA - WAIVED): 4.7 % — ABNORMAL LOW (ref 4.8–5.6)

## 2021-08-01 NOTE — Progress Notes (Signed)
? ?Assessment & Plan:  ?1. Well adult exam ?Preventive health education provided. ? ?2. Dementia due to medical condition with behavioral disturbance ?Managed by psychiatry. ?- Pneumococcal conjugate vaccine 20-valent (Prevnar 20) ? ?3. Patient request for diagnostic testing ?- Bayer DCA Hb A1c Waived ? ? ?Follow-up: Return in about 1 year (around 08/02/2022) for annual physical.  ? ?Hendricks Limes, MSN, APRN, FNP-C ?[facility] ? ?Subjective:  ?Patient ID: Courtney Allen, female    DOB: 01/31/1966  Age: 56 y.o. MRN: [id number] ? ?Patient Care Team: ?Loman Brooklyn, FNP as PCP - General (Family Medicine) ?Fields, Marga Melnick, MD (Inactive) as Consulting Physician (Gastroenterology)  ? ?CC:  ?Chief Complaint  ?Patient presents with  ? Medical Management of Chronic Issues  ? ? ?HPI ?Courtney Allen presents for her annual physical. She is accompanied by her husband who is her caregiver.  ? ?Occupation: Unemployed, Marital status: Married, Substance use: None ?Last eye exam: last year ?Last dental exam: will schedule for next year ?Diet: low sodium; Exercise: walking, biking ?Last colonoscopy: Never, husband does not think he would be able to get her to drink the prep due to worsening dementia. ?Last mammogram: 08/08/2019; tried to complete this year but patient was not cooperative ?Last pap smear: 07/01/2018 with recommended repeat in 5 years ?Hepatitis C Screening: negative on 07/29/2015 ?Immunizations: Flu Vaccine: up to date ?Tdap Vaccine: up to date  ?Shingrix Vaccine: declined  ?COVID-19 Vaccine: declined ?PNA Vaccine: 2016; Prevnar 20 today ? ?DEPRESSION SCREENING ? ?  08/01/2021  ? 10:10 AM 04/02/2021  ? 10:56 AM 01/10/2021  ? 10:25 AM 07/16/2020  ? 11:37 AM 11/09/2019  ? 10:51 AM 09/15/2019  ? 11:11 AM 01/06/2019  ? 10:51 AM  ?PHQ 2/9 Scores  ?PHQ - 2 Score  0 0 0 0 0 0  ?PHQ- 9 Score  _0 ?Exception Documentation Patient refusal        ?  ? ?Dementia: managed by psychiatry. ? ?Recurrent UTIs:  taking nitrofurantoin nightly.  ? ? ?Review of Systems  ?Unable to perform ROS: Dementia  ? ? ?Current Outpatient Medications:  ?  buPROPion (WELLBUTRIN XL) 150 MG 24 hr tablet, Take 150 mg by mouth daily., Disp: , Rfl:  ?  cholecalciferol (VITAMIN D) 1000 UNITS tablet, Take 1,000 Units by mouth daily., Disp: , Rfl:  ?  hydrOXYzine (ATARAX) 25 MG tablet, Take 25 mg by mouth 2 (two) times daily., Disp: , Rfl:  ?  mirtazapine (REMERON) 30 MG tablet, Take 1 tablet (30 mg total) by mouth at bedtime., Disp: 30 tablet, Rfl: 1 ?  nitrofurantoin (MACRODANTIN) 100 MG capsule, Take 1 capsule (100 mg total) by mouth at bedtime., Disp: 90 capsule, Rfl: 1 ?  QUEtiapine (SEROQUEL) 50 MG tablet, Take 2 tablets by mouth in the morning and at bedtime., Disp: , Rfl:  ? ?No Known Allergies ? ?Past Medical History:  ?Diagnosis Date  ? Bipolar I disorder, most recent episode (or current) unspecified   ? Dementia in conditions classified elsewhere with behavioral disturbance 01/18/2013  ? 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   ? Depression   ? Developmental delay   ? Fetal alcohol syndrome 01/18/2013  ? General learning disability 01/18/2013  ? Memory loss   ? Mental retardation, moderate (I.Q. 35-49) 01/18/2013  ? Mixed hyperlipidemia 07/17/2020  ? Retinitis pigmentosa, both eyes 01/18/2013  ? Urinary incontinence due to cognitive impairment 01/18/2013  ? ?Past Surgical History:  ?  Procedure Laterality Date  ? CESAREAN SECTION    ? TONSILLECTOMY    ? TUBAL LIGATION    ? ?Family History  ?Problem Relation Age of Onset  ? Alcohol abuse Mother   ? Heart attack Mother 37  ? Diabetes Mother   ? Cancer Mother   ?     Deceased in mid 51s, either colon cancer or pancreatic cancer reported per patient's spouse  ? Emphysema Brother   ? Diabetes Brother   ? ? ?Social History  ? ?Socioeconomic History  ? Marital status: Married  ?  Spouse name: Jenny Reichmann  ? Number of children: 3  ? Years of education: 49  ? Highest  education level: Not on file  ?Occupational History  ? Occupation: disabled  ?Tobacco Use  ? Smoking status: Never  ? Smokeless tobacco: Never  ?Substance and Sexual Activity  ? Alcohol use: No  ? Drug use: No  ? Sexual activity: Not on file  ?Other Topics Concern  ? Not on file  ?Social History Narrative  ? Not on file  ? ?Social Determinants of Health  ? ?Financial Resource Strain: Not on file  ?Food Insecurity: Not on file  ?Transportation Needs: Not on file  ?Physical Activity: Not on file  ?Stress: Not on file  ?Social Connections: Not on file  ?Intimate Partner Violence: Not on file  ? ? ?  ?Objective:  ?  ?BP 91/64   Pulse 98   Temp (!) 97.4 ?F (36.3 ?C) (Temporal)   Ht _0  (1.702 m)   Wt 159 lb 3.2 oz (72.2 kg)   SpO2 95%   BMI 24.93 kg/m?  ? ?Wt Readings from Last 3 Encounters:  ?08/01/21 159 lb 3.2 oz (72.2 kg)  ?04/02/21 169 lb 9.6 oz (76.9 kg)  ?01/10/21 180 lb 3.2 oz (81.7 kg)  ? ? ?Physical Exam ?Vitals reviewed.  ?Constitutional:   ?   General: She is not in acute distress. ?   Appearance: Normal appearance. She is normal weight. She is not ill-appearing, toxic-appearing or diaphoretic.  ?HENT:  ?   Head: Normocephalic and atraumatic.  ?   Right Ear: Tympanic membrane, ear canal and external ear normal. There is no impacted cerumen.  ?   Left Ear: Tympanic membrane, ear canal and external ear normal. There is no impacted cerumen.  ?   Nose: Nose normal. No congestion or rhinorrhea.  ?   Mouth/Throat:  ?   Mouth: Mucous membranes are moist.  ?   Pharynx: Oropharynx is clear. No oropharyngeal exudate or posterior oropharyngeal erythema.  ?Eyes:  ?   General: No scleral icterus.    ?   Right eye: No discharge.     ?   Left eye: No discharge.  ?   Conjunctiva/sclera: Conjunctivae normal.  ?   Pupils: Pupils are equal, round, and reactive to light.  ?Cardiovascular:  ?   Rate and Rhythm: Normal rate and regular rhythm.  ?   Heart sounds: Normal heart sounds. No murmur heard. ?  No friction rub. No  gallop.  ?Pulmonary:  ?   Effort: Pulmonary effort is normal. No respiratory distress.  ?   Breath sounds: Normal breath sounds. No stridor. No wheezing, rhonchi or rales.  ?Abdominal:  ?   General: Abdomen is flat. Bowel sounds are normal. There is no distension.  ?   Palpations: Abdomen is soft. There is no mass.  ?   Tenderness: There is no abdominal tenderness. There is no guarding or  rebound.  ?   Hernia: No hernia is present.  ?Musculoskeletal:     ?   General: Normal range of motion.  ?   Cervical back: Normal range of motion and neck supple. No rigidity. No muscular tenderness.  ?Lymphadenopathy:  ?   Cervical: No cervical adenopathy.  ?Skin: ?   General: Skin is warm and dry.  ?   Capillary Refill: Capillary refill takes less than 2 seconds.  ?Neurological:  ?   General: No focal deficit present.  ?   Mental Status: She is alert and oriented to person, place, and time. Mental status is at baseline.  ?Psychiatric:     ?   Mood and Affect: Mood normal.     ?   Behavior: Behavior normal.     ?   Thought Content: Thought content normal.     ?   Judgment: Judgment normal.  ? ? ?Lab Results  ?Component Value Date  ? TSH 2.700 05/28/2021  ? ?Lab Results  ?Component Value Date  ? WBC 3.8 05/28/2021  ? HGB 12.8 05/28/2021  ? HCT 38.0 05/28/2021  ? MCV 94 05/28/2021  ? PLT 166 05/28/2021  ? ?Lab Results  ?Component Value Date  ? NA 139 05/28/2021  ? K 4.2 05/28/2021  ? CO2 24 05/28/2021  ? GLUCOSE 90 05/28/2021  ? BUN 13 05/28/2021  ? CREATININE 0.88 05/28/2021  ? BILITOT 0.4 05/28/2021  ? ALKPHOS 95 05/28/2021  ? AST 13 05/28/2021  ? ALT 12 05/28/2021  ? PROT 6.3 05/28/2021  ? ALBUMIN 3.9 05/28/2021  ? CALCIUM 9.3 05/28/2021  ? ANIONGAP 9 03/15/2021  ? EGFR 77 05/28/2021  ? ?Lab Results  ?Component Value Date  ? CHOL 195 05/28/2021  ? ?Lab Results  ?Component Value Date  ? HDL 59 05/28/2021  ? ?Lab Results  ?Component Value Date  ? LDLCALC 125 (H) 05/28/2021  ? ?Lab Results  ?Component Value Date  ? TRIG 60  05/28/2021  ? ?Lab Results  ?Component Value Date  ? CHOLHDL 3.3 05/28/2021  ? ?No results found for: HGBA1C ? ?  ? ? ?

## 2021-09-26 DIAGNOSIS — L814 Other melanin hyperpigmentation: Secondary | ICD-10-CM | POA: Diagnosis not present

## 2021-09-26 DIAGNOSIS — D225 Melanocytic nevi of trunk: Secondary | ICD-10-CM | POA: Diagnosis not present

## 2021-09-26 DIAGNOSIS — L578 Other skin changes due to chronic exposure to nonionizing radiation: Secondary | ICD-10-CM | POA: Diagnosis not present

## 2021-09-26 DIAGNOSIS — L821 Other seborrheic keratosis: Secondary | ICD-10-CM | POA: Diagnosis not present

## 2021-12-21 ENCOUNTER — Telehealth: Payer: Self-pay | Admitting: Family Medicine

## 2021-12-21 DIAGNOSIS — N390 Urinary tract infection, site not specified: Secondary | ICD-10-CM

## 2021-12-22 NOTE — Telephone Encounter (Signed)
Pts husband called to make sure pharmacy sent [place] the refill request. Says pt only has 2 pills left and needs more refills ASAP.

## 2022-01-27 ENCOUNTER — Ambulatory Visit (INDEPENDENT_AMBULATORY_CARE_PROVIDER_SITE_OTHER): Payer: BC Managed Care – PPO

## 2022-01-27 DIAGNOSIS — Z23 Encounter for immunization: Secondary | ICD-10-CM | POA: Diagnosis not present

## 2022-01-28 DIAGNOSIS — F411 Generalized anxiety disorder: Secondary | ICD-10-CM | POA: Diagnosis not present

## 2022-01-28 DIAGNOSIS — G3109 Other frontotemporal dementia: Secondary | ICD-10-CM | POA: Diagnosis not present

## 2022-01-28 DIAGNOSIS — F02811 Dementia in other diseases classified elsewhere, unspecified severity, with agitation: Secondary | ICD-10-CM | POA: Diagnosis not present

## 2022-06-16 ENCOUNTER — Other Ambulatory Visit: Payer: Self-pay | Admitting: Family Medicine

## 2022-06-16 DIAGNOSIS — N390 Urinary tract infection, site not specified: Secondary | ICD-10-CM

## 2022-07-23 ENCOUNTER — Ambulatory Visit: Payer: BC Managed Care – PPO | Admitting: Family Medicine

## 2022-07-23 ENCOUNTER — Encounter: Payer: Self-pay | Admitting: Family Medicine

## 2022-07-23 VITALS — HR 61 | Temp 97.1°F | Ht 67.0 in | Wt 158.4 lb

## 2022-07-23 DIAGNOSIS — Z79899 Other long term (current) drug therapy: Secondary | ICD-10-CM | POA: Diagnosis not present

## 2022-07-23 DIAGNOSIS — E559 Vitamin D deficiency, unspecified: Secondary | ICD-10-CM | POA: Diagnosis not present

## 2022-07-23 DIAGNOSIS — R6889 Other general symptoms and signs: Secondary | ICD-10-CM | POA: Diagnosis not present

## 2022-07-23 DIAGNOSIS — E782 Mixed hyperlipidemia: Secondary | ICD-10-CM | POA: Diagnosis not present

## 2022-07-23 DIAGNOSIS — R4689 Other symptoms and signs involving appearance and behavior: Secondary | ICD-10-CM | POA: Diagnosis not present

## 2022-07-23 DIAGNOSIS — F02818 Dementia in other diseases classified elsewhere, unspecified severity, with other behavioral disturbance: Secondary | ICD-10-CM | POA: Diagnosis not present

## 2022-07-23 NOTE — Progress Notes (Signed)
Subjective:  Patient ID: Courtney Allen, female    DOB: 09/08/1965, 57 y.o.   MRN: [id number]  Patient Care Team: Baruch Gouty, FNP as PCP - General (Family Medicine) Danie Binder, MD (Inactive) as Consulting Physician (Gastroenterology)   Chief Complaint:  behavior changes (Husband states that it has been going on for 2 weeks.  States she is more hyper than normal. ) and Medical Management of Chronic Issues   HPI: Courtney Allen is a 57 y.o. female presenting on 07/23/2022 for behavior changes (Husband states that it has been going on for 2 weeks.  States she is more hyper than normal. ) and Medical Management of Chronic Issues   1. Behavioral change Husband reports pt has been more hyper than normal over the last several weeks. No fever, chills, weakness, or malaise. No N/V/D. No changes in urine output. States he is concerned she may have an UTI as her behavior has changed.   2. Dementia due to medical condition with behavioral disturbance (H[facility]) Changes in mood and activity over the last several weeks. Followed by behavioral health but has not seen them. No recent changes in medications.   3. Mixed hyperlipidemia Not on statin therapy but is on over the counter antilipemic supplements per husband. No diet or exercise routine.   4. Vitamin D deficiency On repletion therapy.      Relevant past medical, surgical, family, and social history reviewed and updated as indicated.  Allergies and medications reviewed and updated. Data reviewed: Chart in Epic.   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    Depression    Developmental delay    Fetal alcohol syndrome 01/18/2013   General learning disability 01/18/2013   Memory loss    Mental  retardation, moderate (I.Q. 35-49) 01/18/2013   Mixed hyperlipidemia 07/17/2020   Retinitis pigmentosa, both eyes 01/18/2013   Urinary incontinence due to cognitive impairment 01/18/2013    Past Surgical History:  Procedure Laterality Date   CESAREAN SECTION     TONSILLECTOMY     TUBAL LIGATION      Social History   Socioeconomic History   Marital status: Married    Spouse name: Courtney Allen   Number of children: 3   Years of education: 11   Highest education level: Not on file  Occupational History   Occupation: disabled  Tobacco Use   Smoking status: Never   Smokeless tobacco: Never  Substance and Sexual Activity   Alcohol use: No   Drug use: No   Sexual activity: Not on file  Other Topics Concern   Not on file  Social History Narrative   Not on file   Social Determinants of Health   Financial Resource Strain: Not on file  Food Insecurity: Not on file  Transportation Needs: Not on file  Physical Activity: Not on file  Stress: Not on file  Social Connections: Not on file  Intimate Partner Violence: Not on file    Outpatient Encounter Medications as of 07/23/2022  Medication Sig   buPROPion (WELLBUTRIN XL) 150 MG 24 hr tablet Take 150 mg by mouth daily.   cholecalciferol (VITAMIN D) 1000 UNITS tablet Take 1,000 Units by mouth daily.   hydrOXYzine (ATARAX) 25 MG tablet Take 25 mg by mouth 2 (two) times daily.  mirtazapine (REMERON) 30 MG tablet Take 1 tablet (30 mg total) by mouth at bedtime.   nitrofurantoin (MACRODANTIN) 100 MG capsule TAKE 1 CAPSULE BY MOUTH EVERYDAY AT BEDTIME   QUEtiapine (SEROQUEL) 50 MG tablet Take 2 tablets by mouth in the morning and at bedtime.   No facility-administered encounter medications on file as of 07/23/2022.    No Known Allergies  Review of Systems  Unable to perform ROS: Dementia        Objective:  Pulse 61   Temp (!) 97.1 F (36.2 C) (Temporal)   Ht 5\' 7"  (1.702 m)   Wt 158 lb 6.4 oz (71.8 kg)   SpO2 99%   BMI 24.81  kg/m    Wt Readings from Last 3 Encounters:  07/23/22 158 lb 6.4 oz (71.8 kg)  08/01/21 159 lb 3.2 oz (72.2 kg)  04/02/21 169 lb 9.6 oz (76.9 kg)    Physical Exam Vitals and nursing note reviewed.  Constitutional:      General: She is not in acute distress.    Appearance: Normal appearance. She is not ill-appearing, toxic-appearing or diaphoretic.  HENT:     Head: Normocephalic and atraumatic.     Right Ear: Tympanic membrane, ear canal and external ear normal.     Left Ear: Tympanic membrane, ear canal and external ear normal.     Mouth/Throat:     Mouth: Mucous membranes are moist.     Pharynx: Oropharynx is clear.  Eyes:     Conjunctiva/sclera: Conjunctivae normal.     Pupils: Pupils are equal, round, and reactive to light.  Cardiovascular:     Rate and Rhythm: Normal rate and regular rhythm.     Heart sounds: Normal heart sounds.  Pulmonary:     Effort: Pulmonary effort is normal.     Breath sounds: Normal breath sounds.  Abdominal:     General: Bowel sounds are normal.     Palpations: Abdomen is soft.  Musculoskeletal:     Cervical back: Normal range of motion and neck supple.     Right lower leg: No edema.     Left lower leg: No edema.  Skin:    General: Skin is warm and dry.     Capillary Refill: Capillary refill takes less than 2 seconds.  Neurological:     Mental Status: She is alert. Mental status is at baseline. She is disoriented and confused.  Psychiatric:        Attention and Perception: She is inattentive.        Mood and Affect: Mood is elated.        Speech: Speech is tangential.        Behavior: Behavior is hyperactive.        Cognition and Memory: Cognition is impaired. Memory is impaired.     Results for orders placed or performed in visit on 08/01/21  Bayer DCA Hb A1c Waived  Result Value Ref Range   HB A1C (BAYER DCA - WAIVED) 4.7 (L) 4.8 - 5.6 %       Pertinent labs & imaging results that were available during my care of the patient  were reviewed by me and considered in my medical decision making.  Assessment & Plan:  Elley was seen today for behavior changes and medical management of chronic issues.  Diagnoses and all orders for this visit:  Dementia due to medical condition with behavioral disturbance ([place]) Behavioral changes Unable to provide urine in office. Will take specimen cup home and  bring back sample. Will check labs for potential underlying causes of changes in behavior. Feel this is just progression of dementia. Discussed this with husband in detail.  -     CMP14+EGFR -     CBC with Differential/Platelet -     Thyroid Panel With TSH -     VITAMIN D 25 Hydroxy (Vit-D Deficiency, Fractures) -     Vitamin B12 -     Urinalysis, Routine w reflex microscopic  Mixed hyperlipidemia Labs pending. Diet and exercise encouraged.  -     CMP14+EGFR -     Lipid panel  High risk medication use Will check labs today.  -     CMP14+EGFR -     CBC with Differential/Platelet -     Vitamin B12  Vitamin D deficiency Labs pending. Continue repletion therapy. If indicated, will change repletion dosage. Eat foods rich in Vit D including milk, orange juice, yogurt with vitamin D added, salmon or mackerel, canned tuna fish, cereals with vitamin D added, and cod liver oil. Get out in the sun but make sure to wear at least SPF 30 sunscreen.  -     VITAMIN D 25 Hydroxy (Vit-D Deficiency, Fractures)     Continue all other maintenance medications.  Follow up plan: Return if symptoms worsen or fail to improve.   Continue healthy lifestyle choices, including diet (rich in fruits, vegetables, and lean proteins, and low in salt and simple carbohydrates) and exercise (at least 30 minutes of moderate physical activity daily).  Educational handout given for dementia  The above assessment and management plan was discussed with the patient. The patient verbalized understanding of and has agreed to the management plan. Patient is  aware to call the clinic if they develop any new symptoms or if symptoms persist or worsen. Patient is aware when to return to the clinic for a follow-up visit. Patient educated on when it is appropriate to go to the emergency department.   Monia Pouch, FNP-C [facility] [phone]

## 2022-07-24 LAB — CBC WITH DIFFERENTIAL/PLATELET
Basophils Absolute: 0.1 10*3/uL (ref 0.0–0.2)
Basos: 1 %
EOS (ABSOLUTE): 0.1 10*3/uL (ref 0.0–0.4)
Eos: 2 %
Hematocrit: 38.3 % (ref 34.0–46.6)
Hemoglobin: 12.7 g/dL (ref 11.1–15.9)
Immature Grans (Abs): 0 10*3/uL (ref 0.0–0.1)
Immature Granulocytes: 0 %
Lymphocytes Absolute: 2 10*3/uL (ref 0.7–3.1)
Lymphs: 39 %
MCH: 31.3 pg (ref 26.6–33.0)
MCHC: 33.2 g/dL (ref 31.5–35.7)
MCV: 94 fL (ref 79–97)
Monocytes Absolute: 0.4 10*3/uL (ref 0.1–0.9)
Monocytes: 7 %
Neutrophils Absolute: 2.7 10*3/uL (ref 1.4–7.0)
Neutrophils: 51 %
Platelets: 206 10*3/uL (ref 150–450)
RBC: 4.06 x10E6/uL (ref 3.77–5.28)
RDW: 12.3 % (ref 11.7–15.4)
WBC: 5.2 10*3/uL (ref 3.4–10.8)

## 2022-07-24 LAB — CMP14+EGFR
ALT: 13 IU/L (ref 0–32)
AST: 15 IU/L (ref 0–40)
Albumin/Globulin Ratio: 1.9 (ref 1.2–2.2)
Albumin: 4.4 g/dL (ref 3.8–4.9)
Alkaline Phosphatase: 73 IU/L (ref 44–121)
BUN/Creatinine Ratio: 24 — ABNORMAL HIGH (ref 9–23)
BUN: 22 mg/dL (ref 6–24)
Bilirubin Total: 0.4 mg/dL (ref 0.0–1.2)
CO2: 18 mmol/L — ABNORMAL LOW (ref 20–29)
Calcium: 9.8 mg/dL (ref 8.7–10.2)
Chloride: 104 mmol/L (ref 96–106)
Creatinine, Ser: 0.9 mg/dL (ref 0.57–1.00)
Globulin, Total: 2.3 g/dL (ref 1.5–4.5)
Glucose: 98 mg/dL (ref 70–99)
Potassium: 3.9 mmol/L (ref 3.5–5.2)
Sodium: 139 mmol/L (ref 134–144)
Total Protein: 6.7 g/dL (ref 6.0–8.5)
eGFR: 75 mL/min/{1.73_m2} (ref >59)

## 2022-07-24 LAB — LIPID PANEL
Chol/HDL Ratio: 3.9 ratio (ref 0.0–4.4)
Cholesterol, Total: 227 mg/dL — ABNORMAL HIGH (ref 100–199)
HDL: 58 mg/dL (ref >39)
LDL Chol Calc (NIH): 147 mg/dL — ABNORMAL HIGH (ref 0–99)
Triglycerides: 122 mg/dL (ref 0–149)
VLDL Cholesterol Cal: 22 mg/dL (ref 5–40)

## 2022-07-24 LAB — VITAMIN B12: Vitamin B-12: 1029 pg/mL (ref 232–1245)

## 2022-07-24 LAB — VITAMIN D 25 HYDROXY (VIT D DEFICIENCY, FRACTURES): Vit D, 25-Hydroxy: 52.2 ng/mL (ref 30.0–100.0)

## 2022-07-24 LAB — THYROID PANEL WITH TSH
Free Thyroxine Index: 2.1 (ref 1.2–4.9)
T3 Uptake Ratio: 31 % (ref 24–39)
T4, Total: 6.9 ug/dL (ref 4.5–12.0)
TSH: 2.6 u[IU]/mL (ref 0.450–4.500)

## 2022-09-04 ENCOUNTER — Telehealth: Payer: Self-pay | Admitting: Family Medicine

## 2022-09-04 NOTE — Telephone Encounter (Signed)
Mr. Ogbonna called in this morning to cancel his wife's appt on 09-08-22 with [facility] for Annual Physical. I asked him if I needed to reschedule her appointment and he said NO, he didn't like her doctor.

## 2022-09-08 ENCOUNTER — Encounter: Payer: BC Managed Care – PPO | Admitting: Family Medicine

## 2022-09-11 DIAGNOSIS — F4312 Post-traumatic stress disorder, chronic: Secondary | ICD-10-CM | POA: Diagnosis not present

## 2022-09-11 DIAGNOSIS — G3109 Other frontotemporal dementia: Secondary | ICD-10-CM | POA: Diagnosis not present

## 2022-09-11 DIAGNOSIS — F411 Generalized anxiety disorder: Secondary | ICD-10-CM | POA: Diagnosis not present

## 2022-09-18 DIAGNOSIS — Z7689 Persons encountering health services in other specified circumstances: Secondary | ICD-10-CM | POA: Diagnosis not present

## 2022-09-18 DIAGNOSIS — Q860 Fetal alcohol syndrome (dysmorphic): Secondary | ICD-10-CM | POA: Diagnosis not present

## 2022-09-18 DIAGNOSIS — F71 Moderate intellectual disabilities: Secondary | ICD-10-CM | POA: Diagnosis not present

## 2022-09-18 DIAGNOSIS — Z0000 Encounter for general adult medical examination without abnormal findings: Secondary | ICD-10-CM | POA: Diagnosis not present

## 2022-12-09 ENCOUNTER — Other Ambulatory Visit: Payer: Self-pay | Admitting: Family Medicine

## 2022-12-09 DIAGNOSIS — N390 Urinary tract infection, site not specified: Secondary | ICD-10-CM

## 2023-02-15 DIAGNOSIS — Z23 Encounter for immunization: Secondary | ICD-10-CM | POA: Diagnosis not present

## 2023-03-05 DIAGNOSIS — G3109 Other frontotemporal dementia: Secondary | ICD-10-CM | POA: Diagnosis not present

## 2023-03-05 DIAGNOSIS — F411 Generalized anxiety disorder: Secondary | ICD-10-CM | POA: Diagnosis not present

## 2023-04-07 ENCOUNTER — Telehealth: Payer: Self-pay | Admitting: Family Medicine

## 2023-04-07 NOTE — Telephone Encounter (Signed)
Patient husband called in needing for dr Reginia Forts to sen over patient lab work to her dr the psychiatrist lisa polo's fax number [phone]

## 2023-04-07 NOTE — Telephone Encounter (Signed)
Per request last labs faxed

## 2023-04-07 NOTE — Telephone Encounter (Signed)
Can we send last labs to this fax number Please

## 2023-10-30 IMAGING — CT CT HEAD W/O CM
5 of 9 series · 17 of 47 positions shown, 18 images · non-contrast
Comparison: None.

CLINICAL DATA: Altered mental status.  Mental status change.

EXAM:
CT HEAD WITHOUT CONTRAST
TECHNIQUE: Contiguous axial images were obtained from the base of the skull
through the vertex without intravenous contrast.

[Series 3: head wo · axial · 0.43mm/px · z∈[-242,-62]mm · 3 of 37 slices shown, 4 images]
[im 1/37  brain]
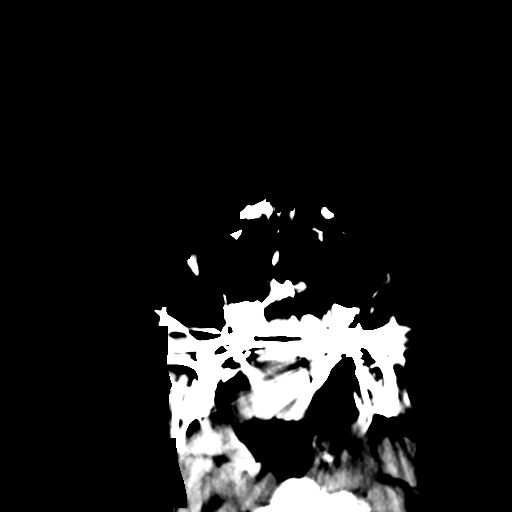
[im 1/37  bone]
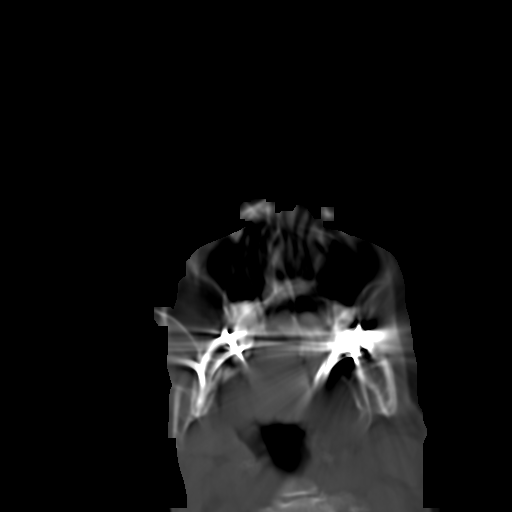
[im 19/37  brain]
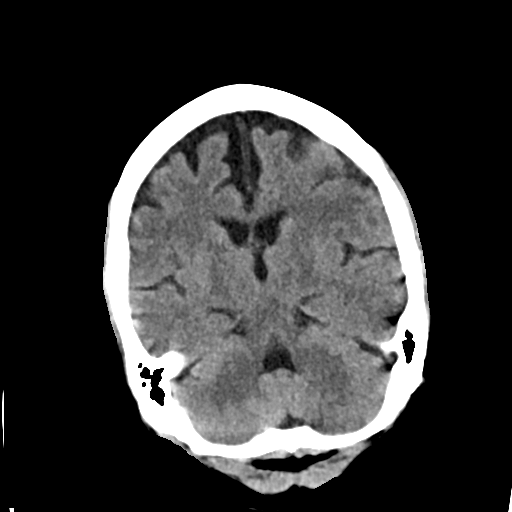
[im 37/37  brain]
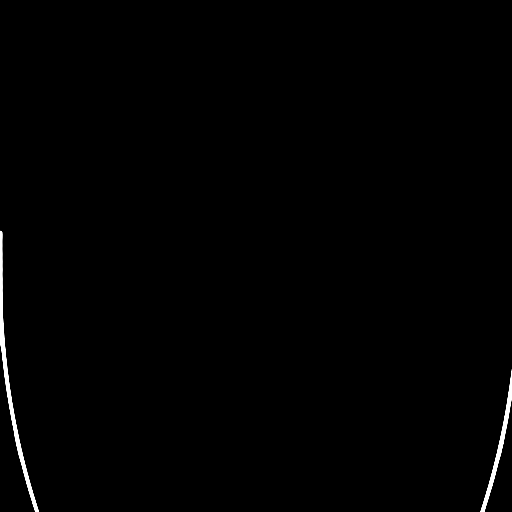

[Series 4: head bone · axial · 0.43mm/px · z∈[-216,-86]mm · 6 of 93 slices shown (1 of 2)]
[im 14/93  bone]
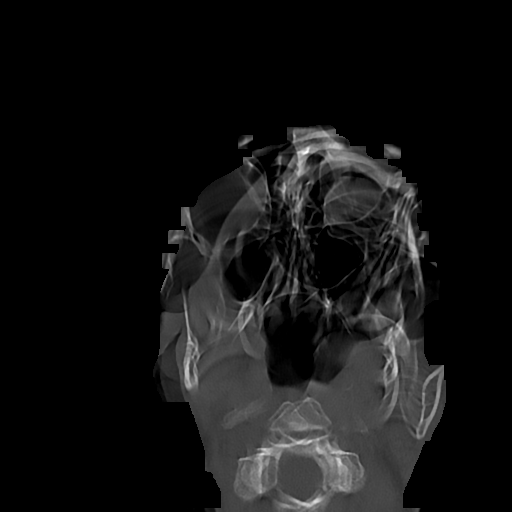
[im 27/93  bone]
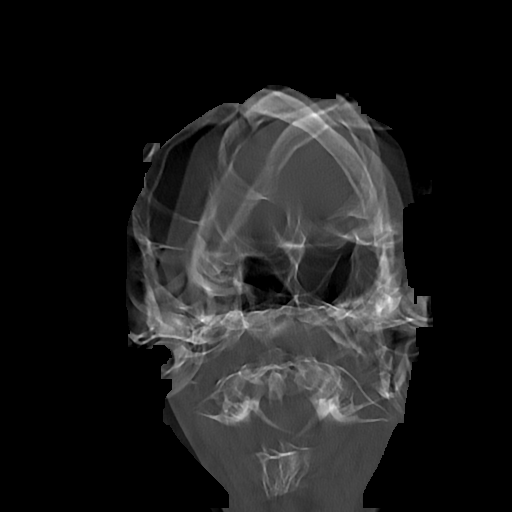
[im 40/93  bone]
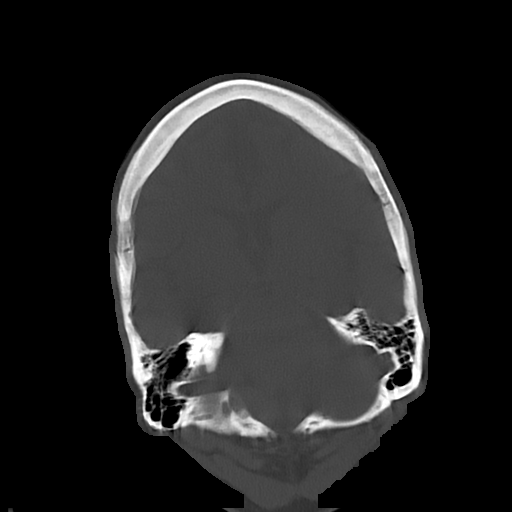
[im 53/93  bone]
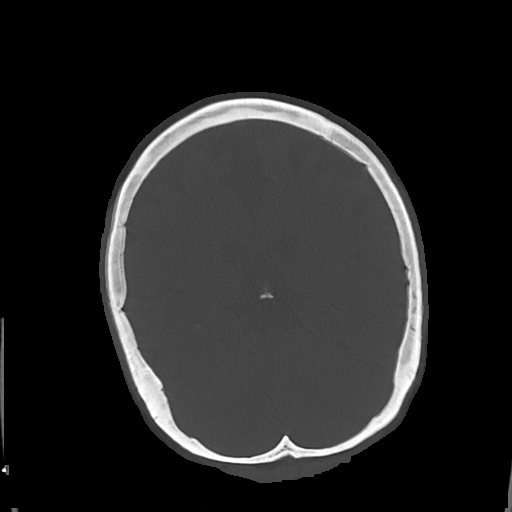
[im 66/93  bone]
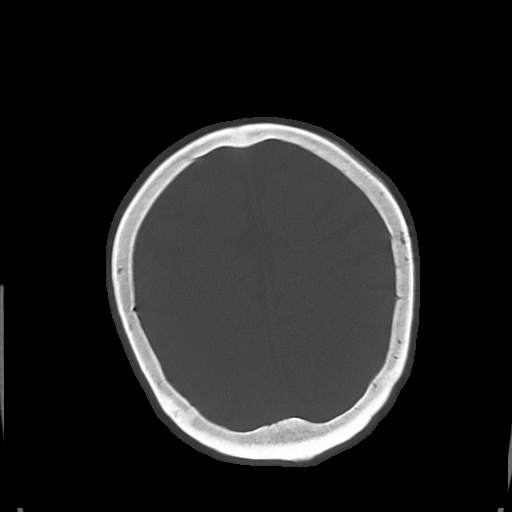
[im 79/93  bone]
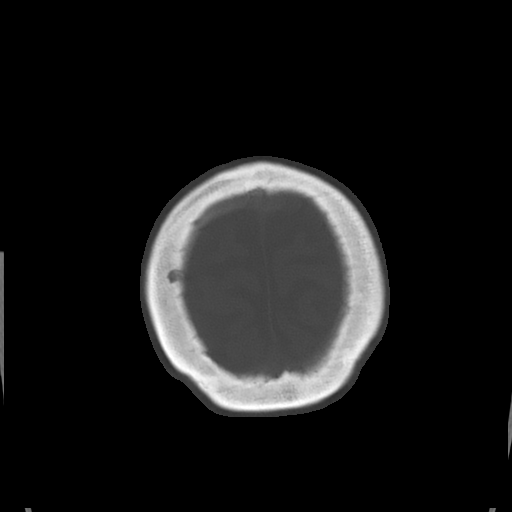

[Series 5: cor soft · coronal · 0.32mm/px · 3 of 68 slices shown]
[im 23/68  brain]
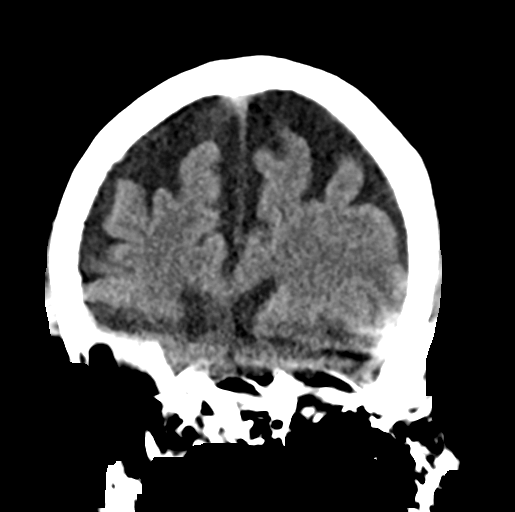
[im 30/68  brain]
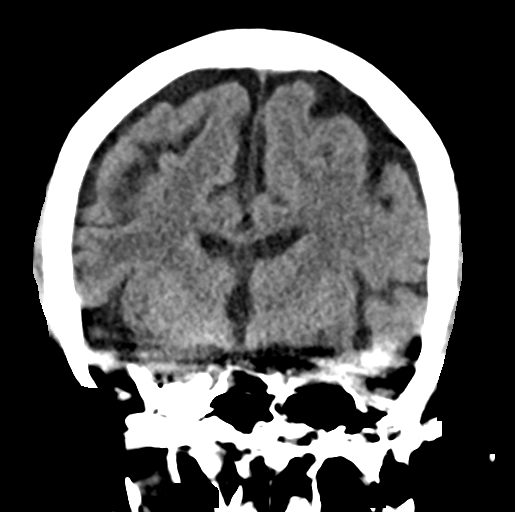
[im 38/68  brain]
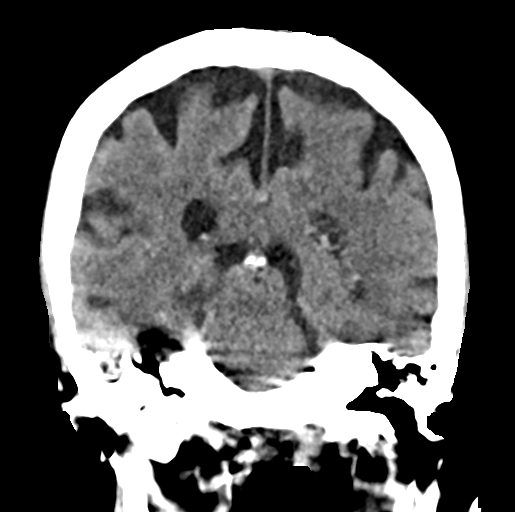

[Series 6: sag soft · sagittal · 0.32mm/px · 3 of 48 slices shown]
[im 16/48  brain]
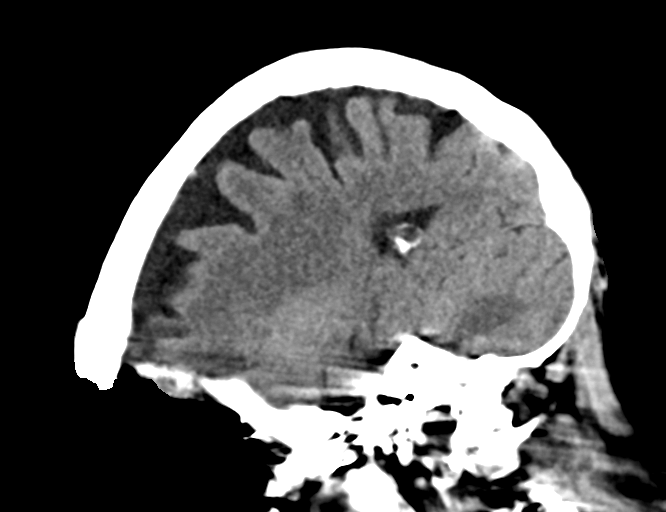
[im 24/48  brain]
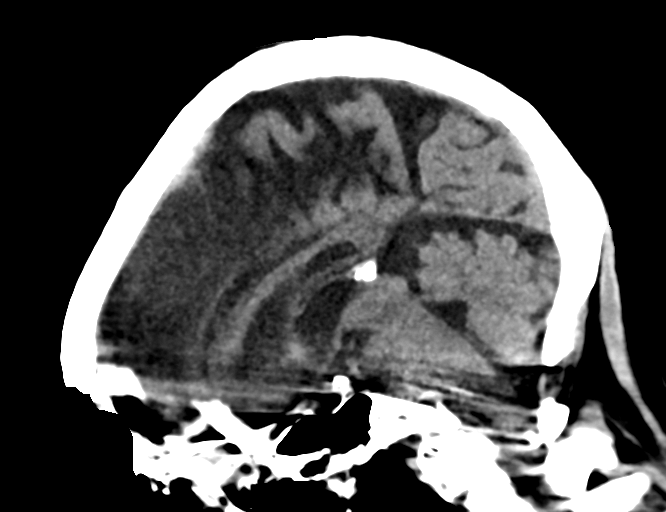
[im 32/48  brain]
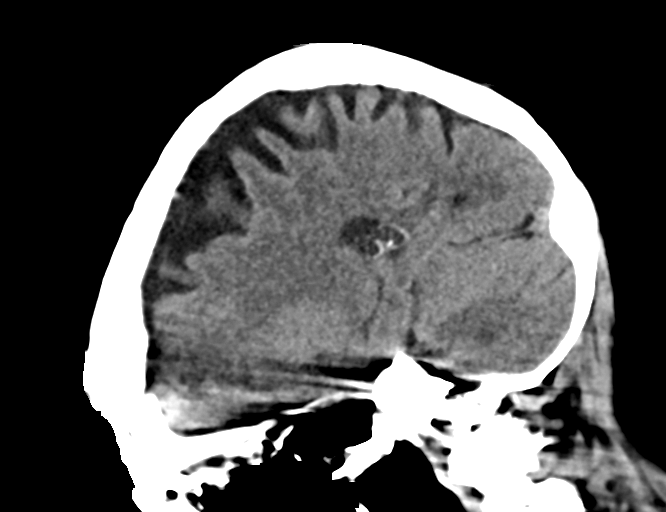

[Series 9: head bone · axial · 0.34mm/px · z∈[-211,-184]mm · 2 of 84 slices shown (2 of 2)]
[im 14/84  bone]
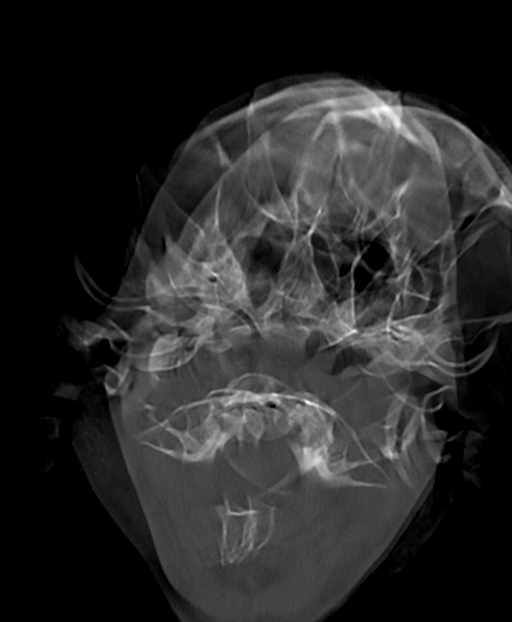
[im 28/84  bone]
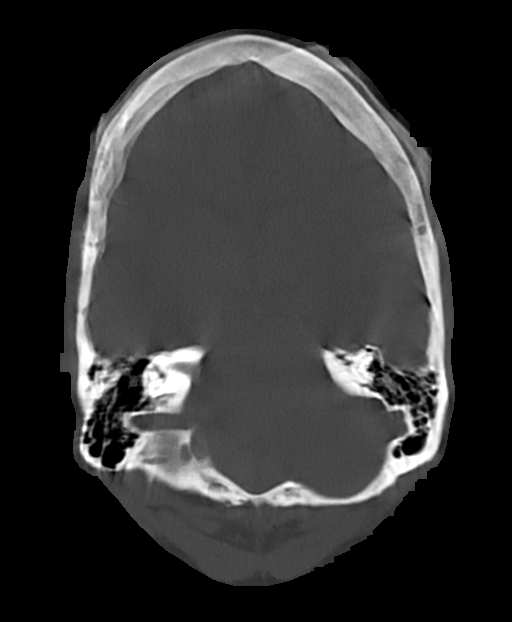

[17 of 47 positions shown; findings below may reference images not displayed]

FINDINGS: Brain: Patient had difficulty tolerating the exam, there is motion
artifact. Generalized atrophy, advanced for age. Mild
periventricular chronic small vessel ischemia. No hemorrhage,
evidence of acute infarct, hydrocephalus, midline shift or mass
effect. No obvious subdural collection.

Vascular: Limited assessment of the MCA region due to motion on all
provided acquisitions. There is no obvious hyperdense vessel.

Skull: Skull base is obscured by motion.  Otherwise negative.

Sinuses/Orbits: No acute findings.

Other: None.
IMPRESSION: 1. Motion limited exam. No obvious acute abnormality.
2. Generalized atrophy, advanced for age. Mild chronic small vessel
ischemia.
